# Patient Record
Sex: Female | Born: 1980 | Hispanic: Yes | Marital: Single | Smoking: Former smoker
Health system: Southern US, Community
[De-identification: ages and names within clinical notes are randomized; demographics above are authoritative.]

## PROBLEM LIST (undated history)

## (undated) HISTORY — PX: LAPAROSCOPIC GASTRIC SLEEVE RESECTION: SHX5895

## (undated) HISTORY — PX: BREAST ENHANCEMENT SURGERY: SHX7

---

## 2011-05-10 DIAGNOSIS — B009 Herpesviral infection, unspecified: Secondary | ICD-10-CM | POA: Insufficient documentation

## 2013-09-16 DIAGNOSIS — B977 Papillomavirus as the cause of diseases classified elsewhere: Secondary | ICD-10-CM | POA: Insufficient documentation

## 2015-11-19 LAB — OB RESULTS CONSOLE ABO/RH: RH Type: POSITIVE

## 2015-11-19 LAB — OB RESULTS CONSOLE ANTIBODY SCREEN: ANTIBODY SCREEN: NEGATIVE

## 2015-11-19 LAB — OB RESULTS CONSOLE HEPATITIS B SURFACE ANTIGEN: HEP B S AG: NEGATIVE

## 2015-11-19 LAB — OB RESULTS CONSOLE GC/CHLAMYDIA
CHLAMYDIA, DNA PROBE: NEGATIVE
GC PROBE AMP, GENITAL: NEGATIVE

## 2015-11-19 LAB — OB RESULTS CONSOLE HIV ANTIBODY (ROUTINE TESTING): HIV: NONREACTIVE

## 2015-11-19 LAB — OB RESULTS CONSOLE RUBELLA ANTIBODY, IGM: Rubella: IMMUNE

## 2015-11-19 LAB — OB RESULTS CONSOLE RPR: RPR: NONREACTIVE

## 2015-12-08 DIAGNOSIS — Z3490 Encounter for supervision of normal pregnancy, unspecified, unspecified trimester: Secondary | ICD-10-CM | POA: Insufficient documentation

## 2016-03-10 DIAGNOSIS — O99013 Anemia complicating pregnancy, third trimester: Secondary | ICD-10-CM | POA: Insufficient documentation

## 2016-03-17 ENCOUNTER — Encounter: Payer: Self-pay | Admitting: Obstetrics & Gynecology

## 2016-03-21 ENCOUNTER — Ambulatory Visit (INDEPENDENT_AMBULATORY_CARE_PROVIDER_SITE_OTHER): Payer: Medicaid Other | Admitting: Obstetrics and Gynecology

## 2016-03-21 ENCOUNTER — Encounter: Payer: Self-pay | Admitting: Obstetrics and Gynecology

## 2016-03-21 DIAGNOSIS — O99013 Anemia complicating pregnancy, third trimester: Secondary | ICD-10-CM | POA: Diagnosis not present

## 2016-03-21 DIAGNOSIS — D649 Anemia, unspecified: Secondary | ICD-10-CM | POA: Diagnosis not present

## 2016-03-21 DIAGNOSIS — B009 Herpesviral infection, unspecified: Secondary | ICD-10-CM | POA: Insufficient documentation

## 2016-03-21 DIAGNOSIS — Z3403 Encounter for supervision of normal first pregnancy, third trimester: Secondary | ICD-10-CM | POA: Diagnosis not present

## 2016-03-21 NOTE — Progress Notes (Signed)
Pt states that medical records were to be mailed. Not seen in Media tab today. Records in CareEverywhere. Pt states that she has had all pregnancy testing, labs-GTT, ultrasound- pt states she had u/s at 28 weeks- pt states she has small previa, now resolved.

## 2016-03-21 NOTE — Patient Instructions (Signed)
Contraception Choices Contraception (birth control) is the use of any methods or devices to prevent pregnancy. Below are some methods to help avoid pregnancy. HORMONAL METHODS   Contraceptive implant. This is a thin, plastic tube containing progesterone hormone. It does not contain estrogen hormone. Your health care provider inserts the tube in the inner part of the upper arm. The tube can remain in place for up to 3 years. After 3 years, the implant must be removed. The implant prevents the ovaries from releasing an egg (ovulation), thickens the cervical mucus to prevent sperm from entering the uterus, and thins the lining of the inside of the uterus.  Progesterone-only injections. These injections are given every 3 months by your health care provider to prevent pregnancy. This synthetic progesterone hormone stops the ovaries from releasing eggs. It also thickens cervical mucus and changes the uterine lining. This makes it harder for sperm to survive in the uterus.  Birth control pills. These pills contain estrogen and progesterone hormone. They work by preventing the ovaries from releasing eggs (ovulation). They also cause the cervical mucus to thicken, preventing the sperm from entering the uterus. Birth control pills are prescribed by a health care provider.Birth control pills can also be used to treat heavy periods.  Minipill. This type of birth control pill contains only the progesterone hormone. They are taken every day of each month and must be prescribed by your health care provider.  Birth control patch. The patch contains hormones similar to those in birth control pills. It must be changed once a week and is prescribed by a health care provider.  Vaginal ring. The ring contains hormones similar to those in birth control pills. It is left in the vagina for 3 weeks, removed for 1 week, and then a new one is put back in place. The patient must be comfortable inserting and removing the ring  from the vagina.A health care provider's prescription is necessary.  Emergency contraception. Emergency contraceptives prevent pregnancy after unprotected sexual intercourse. This pill can be taken right after sex or up to 5 days after unprotected sex. It is most effective the sooner you take the pills after having sexual intercourse. Most emergency contraceptive pills are available without a prescription. Check with your pharmacist. Do not use emergency contraception as your only form of birth control. BARRIER METHODS   Female condom. This is a thin sheath (latex or rubber) that is worn over the penis during sexual intercourse. It can be used with spermicide to increase effectiveness.  Female condom. This is a soft, loose-fitting sheath that is put into the vagina before sexual intercourse.  Diaphragm. This is a soft, latex, dome-shaped barrier that must be fitted by a health care provider. It is inserted into the vagina, along with a spermicidal jelly. It is inserted before intercourse. The diaphragm should be left in the vagina for 6 to 8 hours after intercourse.  Cervical cap. This is a round, soft, latex or plastic cup that fits over the cervix and must be fitted by a health care provider. The cap can be left in place for up to 48 hours after intercourse.  Sponge. This is a soft, circular piece of polyurethane foam. The sponge has spermicide in it. It is inserted into the vagina after wetting it and before sexual intercourse.  Spermicides. These are chemicals that kill or block sperm from entering the cervix and uterus. They come in the form of creams, jellies, suppositories, foam, or tablets. They do not require a   prescription. They are inserted into the vagina with an applicator before having sexual intercourse. The process must be repeated every time you have sexual intercourse. INTRAUTERINE CONTRACEPTION  Intrauterine device (IUD). This is a T-shaped device that is put in a woman's uterus  during a menstrual period to prevent pregnancy. There are 2 types:  Copper IUD. This type of IUD is wrapped in copper wire and is placed inside the uterus. Copper makes the uterus and fallopian tubes produce a fluid that kills sperm. It can stay in place for 10 years.  Hormone IUD. This type of IUD contains the hormone progestin (synthetic progesterone). The hormone thickens the cervical mucus and prevents sperm from entering the uterus, and it also thins the uterine lining to prevent implantation of a fertilized egg. The hormone can weaken or kill the sperm that get into the uterus. It can stay in place for 3-5 years, depending on which type of IUD is used. PERMANENT METHODS OF CONTRACEPTION  Female tubal ligation. This is when the woman's fallopian tubes are surgically sealed, tied, or blocked to prevent the egg from traveling to the uterus.  Hysteroscopic sterilization. This involves placing a small coil or insert into each fallopian tube. Your doctor uses a technique called hysteroscopy to do the procedure. The device causes scar tissue to form. This results in permanent blockage of the fallopian tubes, so the sperm cannot fertilize the egg. It takes about 3 months after the procedure for the tubes to become blocked. You must use another form of birth control for these 3 months.  Female sterilization. This is when the female has the tubes that carry sperm tied off (vasectomy).This blocks sperm from entering the vagina during sexual intercourse. After the procedure, the man can still ejaculate fluid (semen). NATURAL PLANNING METHODS  Natural family planning. This is not having sexual intercourse or using a barrier method (condom, diaphragm, cervical cap) on days the woman could become pregnant.  Calendar method. This is keeping track of the length of each menstrual cycle and identifying when you are fertile.  Ovulation method. This is avoiding sexual intercourse during ovulation.  Symptothermal  method. This is avoiding sexual intercourse during ovulation, using a thermometer and ovulation symptoms.  Post-ovulation method. This is timing sexual intercourse after you have ovulated. Regardless of which type or method of contraception you choose, it is important that you use condoms to protect against the transmission of sexually transmitted infections (STIs). Talk with your health care provider about which form of contraception is most appropriate for you.   This information is not intended to replace advice given to you by your health care provider. Make sure you discuss any questions you have with your health care provider.   Document Released: 04/25/2005 Document Revised: 04/30/2013 Document Reviewed: 10/18/2012 Elsevier Interactive Patient Education 2016 [employer]  

## 2016-03-21 NOTE — Progress Notes (Signed)
   PRENATAL VISIT NOTE  Subjective:  Evelyn [name] is a 35 y.o. G1P0 at 2249w5d being seen today for first prenatal visit with this office. [facility] were reviewed and she has had an uncomplicated pregnancy thus far. All labs are up to date and have been reviewed. She is currently monitored for the following issues for this low-risk pregnancy and has Supervision of normal first pregnancy in third trimester and Anogenital herpes simplex virus (HSV) infection on her problem list.  Patient reports no complaints.  Contractions: Not present. Vag. Bleeding: None.  Movement: Present. Denies leaking of fluid.   The following portions of the patient's history were reviewed and updated as appropriate: allergies, current medications, past family history, past medical history, past social history, past surgical history and problem list. Problem list updated.  Objective:   Vitals:   03/21/16 0832  BP: 122/80  Pulse: 92  Temp: 98 F (36.7 C)  Weight: 197 lb 3.2 oz (89.4 kg)    Fetal Status: Fetal Heart Rate (bpm): 156 Fundal Height: 29 cm Movement: Present     General:  Alert, oriented and cooperative. Patient is in no acute distress.  Skin: Skin is warm and dry. No rash noted.   Cardiovascular: Normal heart rate noted  Respiratory: Normal respiratory effort, no problems with respiration noted  Abdomen: Soft, gravid, appropriate for gestational age. Pain/Pressure: Present     Pelvic:  Cervical exam deferred        Extremities: Normal range of motion.     Mental Status: Normal mood and affect. Normal behavior. Normal judgment and thought content.   Assessment and Plan:  Pregnancy: G1P0 at 2149w5d  1. Supervision of first normal pregnancy - patient is doing well without complaints - Information regarding our practise reviewed with the patient  Preterm labor symptoms and general obstetric precautions including but not limited to vaginal bleeding, contractions,  leaking of fluid and fetal movement were reviewed in detail with the patient. Please refer to After Visit Summary for other counseling recommendations.  Return in about 2 weeks (around 04/04/2016) for ROB.   Catalina AntiguaPeggy Dineen Conradt, MD

## 2016-03-22 ENCOUNTER — Encounter: Payer: Self-pay | Admitting: *Deleted

## 2016-04-05 ENCOUNTER — Ambulatory Visit (INDEPENDENT_AMBULATORY_CARE_PROVIDER_SITE_OTHER): Payer: Medicaid Other | Admitting: Obstetrics & Gynecology

## 2016-04-05 VITALS — BP 108/70 | HR 88 | Temp 97.2°F | Wt 204.2 lb

## 2016-04-05 DIAGNOSIS — O98313 Other infections with a predominantly sexual mode of transmission complicating pregnancy, third trimester: Secondary | ICD-10-CM

## 2016-04-05 DIAGNOSIS — O99013 Anemia complicating pregnancy, third trimester: Secondary | ICD-10-CM | POA: Diagnosis not present

## 2016-04-05 DIAGNOSIS — A6009 Herpesviral infection of other urogenital tract: Secondary | ICD-10-CM

## 2016-04-05 DIAGNOSIS — A609 Anogenital herpesviral infection, unspecified: Secondary | ICD-10-CM

## 2016-04-05 DIAGNOSIS — Z3403 Encounter for supervision of normal first pregnancy, third trimester: Secondary | ICD-10-CM | POA: Diagnosis not present

## 2016-04-05 DIAGNOSIS — D649 Anemia, unspecified: Secondary | ICD-10-CM | POA: Diagnosis not present

## 2016-04-05 NOTE — Progress Notes (Signed)
Patient states that she feels good, reports good fetal movement. 

## 2016-04-05 NOTE — Progress Notes (Signed)
   PRENATAL VISIT NOTE  Subjective:  [name] is a 35 y.o. G1P0 at 8149w6d being seen today for ongoing prenatal care.  She is currently monitored for the following issues for this low-risk pregnancy and has Supervision of normal first pregnancy in third trimester and Anogenital herpes simplex virus (HSV) infection on her problem list.  Patient reports no complaints.  Contractions: Not present. Vag. Bleeding: None.  Movement: Present. Denies leaking of fluid.   The following portions of the patient's history were reviewed and updated as appropriate: allergies, current medications, past family history, past medical history, past social history, past surgical history and problem list. Problem list updated.  Objective:   Vitals:   04/05/16 1108  BP: 108/70  Pulse: 88  Temp: 97.2 F (36.2 C)  Weight: 204 lb 3.2 oz (92.6 kg)    Fetal Status: Fetal Heart Rate (bpm): 156   Movement: Present     General:  Alert, oriented and cooperative. Patient is in no acute distress.  Skin: Skin is warm and dry. No rash noted.   Cardiovascular: Normal heart rate noted  Respiratory: Normal respiratory effort, no problems with respiration noted  Abdomen: Soft, gravid, appropriate for gestational age. Pain/Pressure: Absent     Pelvic:  Cervical exam deferred        Extremities: Normal range of motion.  Edema: None  Mental Status: Normal mood and affect. Normal behavior. Normal judgment and thought content.   Assessment and Plan:  Pregnancy: G1P0 at 8049w6d  1. Supervision of normal first pregnancy in third trimester Doing well H/O lab dx of exposure to HSV with no outbreak  Preterm labor symptoms and general obstetric precautions including but not limited to vaginal bleeding, contractions, leaking of fluid and fetal movement were reviewed in detail with the patient. Please refer to After Visit Summary for other counseling recommendations.  Return in about 2 weeks (around 04/19/2016).   Adam PhenixJames G  Arnold, MD

## 2016-04-05 NOTE — Patient Instructions (Signed)

## 2016-04-15 ENCOUNTER — Encounter: Payer: Self-pay | Admitting: General Practice

## 2016-04-25 ENCOUNTER — Ambulatory Visit (INDEPENDENT_AMBULATORY_CARE_PROVIDER_SITE_OTHER): Payer: Medicaid Other | Admitting: Certified Nurse Midwife

## 2016-04-25 VITALS — BP 122/82 | HR 88 | Wt 208.8 lb

## 2016-04-25 DIAGNOSIS — D649 Anemia, unspecified: Secondary | ICD-10-CM | POA: Diagnosis not present

## 2016-04-25 DIAGNOSIS — K219 Gastro-esophageal reflux disease without esophagitis: Secondary | ICD-10-CM | POA: Insufficient documentation

## 2016-04-25 DIAGNOSIS — Z3403 Encounter for supervision of normal first pregnancy, third trimester: Secondary | ICD-10-CM | POA: Diagnosis not present

## 2016-04-25 DIAGNOSIS — B009 Herpesviral infection, unspecified: Secondary | ICD-10-CM

## 2016-04-25 DIAGNOSIS — O99013 Anemia complicating pregnancy, third trimester: Secondary | ICD-10-CM

## 2016-04-25 DIAGNOSIS — Z9889 Other specified postprocedural states: Secondary | ICD-10-CM

## 2016-04-25 DIAGNOSIS — O99613 Diseases of the digestive system complicating pregnancy, third trimester: Secondary | ICD-10-CM

## 2016-04-25 DIAGNOSIS — Z9884 Bariatric surgery status: Secondary | ICD-10-CM | POA: Insufficient documentation

## 2016-04-25 MED ORDER — OMEPRAZOLE 20 MG PO CPDR: 20.0000 mg | DELAYED_RELEASE_CAPSULE | Freq: Two times a day (BID) | ORAL | 5 refills | Status: DC

## 2016-04-25 MED ORDER — VALACYCLOVIR HCL 1 G PO TABS: 1000.0000 mg | ORAL_TABLET | Freq: Every day | ORAL | 1 refills | Status: DC

## 2016-04-25 NOTE — Progress Notes (Signed)
Subjective:    Evelyn [name] is a 35 y.o. female being seen today for her obstetrical visit. She is at 2144w5d gestation. Patient reports no bleeding, no contractions, no cramping, no leaking and pressure with prolonged standing/walking, vertigo in the shower.  Has a hx of anemia, recheck cbc today. . Fetal movement: normal.  Problem List Items Addressed This Visit      Other   Supervision of normal first pregnancy in third trimester - Primary   Herpes simplex virus (HSV) infection   Relevant Medications   valACYclovir (VALTREX) 1000 MG tablet   History of gastric bypass   Gastroesophageal reflux in pregnancy in third trimester   Relevant Medications   omeprazole (PRILOSEC) 20 MG capsule   Anemia during pregnancy in third trimester   Relevant Orders   CBC     Patient Active Problem List   Diagnosis Date Noted  . History of gastric bypass 04/25/2016  . Gastroesophageal reflux in pregnancy in third trimester 04/25/2016  . Anemia during pregnancy in third trimester 04/25/2016  . Supervision of normal first pregnancy in third trimester 03/21/2016  . Herpes simplex virus (HSV) infection 03/21/2016   Objective:    BP 122/82   Pulse 88   Wt 208 lb 12.8 oz (94.7 kg)   LMP 08/26/2015 (Exact Date)  FHT:  131 BPM  Uterine Size: 34 cm and size equals dates  Presentation: cephalic     Assessment:    Pregnancy @ 4144w5d weeks   H/O Gastric bypass  H/O HSV by blood work: no hx of outbreak  H/O anemia  Reflux in pregnancy  Plan:   Check CBC, hx of anemia   labs reviewed, problem list updated   PTL precautions given GBS planning TDAP offered  Rhogam: n/a Pediatrician: discussed, list given Infant feeding: plans to breastfeed. Maternity leave: n/a. Cigarette smoking: never smoked. Orders Placed This Encounter  Procedures  . CBC   Meds ordered this encounter  Medications  . valACYclovir (VALTREX) 1000 MG tablet    Sig: Take 1 tablet (1,000 mg total) by mouth daily.   Dispense:  30 tablet    Refill:  1  . omeprazole (PRILOSEC) 20 MG capsule    Sig: Take 1 capsule (20 mg total) by mouth 2 (two) times daily before a meal.    Dispense:  60 capsule    Refill:  5   Follow up in 1 Week.

## 2016-04-25 NOTE — Progress Notes (Signed)
Pressure when sitting or walking for long time. Patient had dizzy spell today when she got out of the shower.

## 2016-05-03 ENCOUNTER — Ambulatory Visit (INDEPENDENT_AMBULATORY_CARE_PROVIDER_SITE_OTHER): Payer: Medicaid Other | Admitting: Certified Nurse Midwife

## 2016-05-03 ENCOUNTER — Encounter: Payer: Self-pay | Admitting: Certified Nurse Midwife

## 2016-05-03 VITALS — BP 113/73 | HR 83 | Wt 214.0 lb

## 2016-05-03 DIAGNOSIS — Z3403 Encounter for supervision of normal first pregnancy, third trimester: Secondary | ICD-10-CM

## 2016-05-03 DIAGNOSIS — B009 Herpesviral infection, unspecified: Secondary | ICD-10-CM

## 2016-05-03 DIAGNOSIS — O99013 Anemia complicating pregnancy, third trimester: Secondary | ICD-10-CM | POA: Diagnosis not present

## 2016-05-03 DIAGNOSIS — D649 Anemia, unspecified: Secondary | ICD-10-CM | POA: Diagnosis not present

## 2016-05-03 LAB — OB RESULTS CONSOLE GBS: STREP GROUP B AG: NEGATIVE

## 2016-05-03 MED ORDER — VALACYCLOVIR HCL 1 G PO TABS: 1000.0000 mg | ORAL_TABLET | Freq: Every day | ORAL | 1 refills | Status: DC

## 2016-05-03 NOTE — Patient Instructions (Addendum)
Third Trimester of Pregnancy The third trimester is from week 29 through week 40 (months 7 through 9). The third trimester is a time when the unborn baby (fetus) is growing rapidly. At the end of the ninth month, the fetus is about 20 inches in length and weighs 6-10 pounds. Body changes during your third trimester Your body goes through many changes during pregnancy. The changes vary from woman to woman. During the third trimester:  Your weight will continue to increase. You can expect to gain 25-35 pounds (11-16 kg) by the end of the pregnancy.  You may begin to get stretch marks on your hips, abdomen, and breasts.  You may urinate more often because the fetus is moving lower into your pelvis and pressing on your bladder.  You may develop or continue to have heartburn. This is caused by increased hormones that slow down muscles in the digestive tract.  You may develop or continue to have constipation because increased hormones slow digestion and cause the muscles that push waste through your intestines to relax.  You may develop hemorrhoids. These are swollen veins (varicose veins) in the rectum that can itch or be painful.  You may develop swollen, bulging veins (varicose veins) in your legs.  You may have increased body aches in the pelvis, back, or thighs. This is due to weight gain and increased hormones that are relaxing your joints.  You may have changes in your hair. These can include thickening of your hair, rapid growth, and changes in texture. Some women also have hair loss during or after pregnancy, or hair that feels dry or thin. Your hair will most likely return to normal after your baby is born.  Your breasts will continue to grow and they will continue to become tender. A yellow fluid (colostrum) may leak from your breasts. This is the first milk you are producing for your baby.  Your belly button may stick out.  You may notice more swelling in your hands, face, or  ankles.  You may have increased tingling or numbness in your hands, arms, and legs. The skin on your belly may also feel numb.  You may feel short of breath because of your expanding uterus.  You may have more problems sleeping. This can be caused by the size of your belly, increased need to urinate, and an increase in your body's metabolism.  You may notice the fetus "dropping," or moving lower in your abdomen.  You may have increased vaginal discharge.  Your cervix becomes thin and soft (effaced) near your due date. What to expect at prenatal visits You will have prenatal exams every 2 weeks until week 36. Then you will have weekly prenatal exams. During a routine prenatal visit:  You will be weighed to make sure you and the fetus are growing normally.  Your blood pressure will be taken.  Your abdomen will be measured to track your baby's growth.  The fetal heartbeat will be listened to.  Any test results from the previous visit will be discussed.  You may have a cervical check near your due date to see if you have effaced. At around 36 weeks, your health care provider will check your cervix. At the same time, your health care provider will also perform a test on the secretions of the vaginal tissue. This test is to determine if a type of bacteria, Group B streptococcus, is present. Your health care provider will explain this further. Your health care provider may ask you:    What your birth plan is.  How you are feeling.  If you are feeling the baby move.  If you have had any abnormal symptoms, such as leaking fluid, bleeding, severe headaches, or abdominal cramping.  If you are using any tobacco products, including cigarettes, chewing tobacco, and electronic cigarettes.  If you have any questions. Other tests or screenings that may be performed during your third trimester include:  Blood tests that check for low iron levels (anemia).  Fetal testing to check the health,  activity level, and growth of the fetus. Testing is done if you have certain medical conditions or if there are problems during the pregnancy.  Nonstress test (NST). This test checks the health of your baby to make sure there are no signs of problems, such as the baby not getting enough oxygen. During this test, a belt is placed around your belly. The baby is made to move, and its heart rate is monitored during movement. What is false labor? False labor is a condition in which you feel small, irregular tightenings of the muscles in the womb (contractions) that eventually go away. These are called Braxton Hicks contractions. Contractions may last for hours, days, or even weeks before true labor sets in. If contractions come at regular intervals, become more frequent, increase in intensity, or become painful, you should see your health care provider. What are the signs of labor?  Abdominal cramps.  Regular contractions that start at 10 minutes apart and become stronger and more frequent with time.  Contractions that start on the top of the uterus and spread down to the lower abdomen and back.  Increased pelvic pressure and dull back pain.  A watery or bloody mucus discharge that comes from the vagina.  Leaking of amniotic fluid. This is also known as your "water breaking." It could be a slow trickle or a gush. Let your doctor know if it has a color or strange odor. If you have any of these signs, call your health care provider right away, even if it is before your due date. Follow these instructions at home: Eating and drinking  Continue to eat regular, healthy meals.  Do not eat:  Raw meat or meat spreads.  Unpasteurized milk or cheese.  Unpasteurized juice.  Store-made salad.  Refrigerated smoked seafood.  Hot dogs or deli meat, unless they are piping hot.  More than 6 ounces of albacore tuna a week.  Shark, swordfish, king mackerel, or tile fish.  Store-made salads.  Raw  sprouts, such as mung bean or alfalfa sprouts.  Take prenatal vitamins as told by your health care provider.  Take 1000 mg of calcium daily as told by your health care provider.  If you develop constipation:  Take over-the-counter or prescription medicines.  Drink enough fluid to keep your urine clear or pale yellow.  Eat foods that are high in fiber, such as fresh fruits and vegetables, whole grains, and beans.  Limit foods that are high in fat and processed sugars, such as fried and sweet foods. Activity  Exercise only as directed by your health care provider. Healthy pregnant women should aim for 2 hours and 30 minutes of moderate exercise per week. If you experience any pain or discomfort while exercising, stop.  Avoid heavy lifting.  Do not exercise in extreme heat or humidity, or at high altitudes.  Wear low-heel, comfortable shoes.  Practice good posture.  Do not travel far distances unless it is absolutely necessary and only with the approval   of your health care provider.  Wear your seat belt at all times while in a car, on a bus, or on a plane.  Take frequent breaks and rest with your legs elevated if you have leg cramps or low back pain.  Do not use hot tubs, steam rooms, or saunas.  You may continue to have sex unless your health care provider tells you otherwise. Lifestyle  Do not use any products that contain nicotine or tobacco, such as cigarettes and e-cigarettes. If you need help quitting, ask your health care provider.  Do not drink alcohol.  Do not use any medicinal herbs or unprescribed drugs. These chemicals affect the formation and growth of the baby.  If you develop varicose veins:  Wear support pantyhose or compression stockings as told by your healthcare provider.  Elevate your feet for 15 minutes, 3-4 times a day.  Wear a supportive maternity bra to help with breast tenderness. General instructions  Take over-the-counter and prescription  medicines only as told by your health care provider. There are medicines that are either safe or unsafe to take during pregnancy.  Take warm sitz baths to soothe any pain or discomfort caused by hemorrhoids. Use hemorrhoid cream or witch hazel if your health care provider approves.  Avoid cat litter boxes and soil used by cats. These carry germs that can cause birth defects in the baby. If you have a cat, ask someone to clean the litter box for you.  To prepare for the arrival of your baby:  Take prenatal classes to understand, practice, and ask questions about the labor and delivery.  Make a trial run to the hospital.  Visit the hospital and tour the maternity area.  Arrange for maternity or paternity leave through employers.  Arrange for family and friends to take care of pets while you are in the hospital.  Purchase a rear-facing car seat and make sure you know how to install it in your car.  Pack your hospital bag.  Prepare the baby's nursery. Make sure to remove all pillows and stuffed animals from the baby's crib to prevent suffocation.  Visit your dentist if you have not gone during your pregnancy. Use a soft toothbrush to brush your teeth and be gentle when you floss.  Keep all prenatal follow-up visits as told by your health care provider. This is important. Contact a health care provider if:  You are unsure if you are in labor or if your water has broken.  You become dizzy.  You have mild pelvic cramps, pelvic pressure, or nagging pain in your abdominal area.  You have lower back pain.  You have persistent nausea, vomiting, or diarrhea.  You have an unusual or bad smelling vaginal discharge.  You have pain when you urinate. Get help right away if:  You have a fever.  You are leaking fluid from your vagina.  You have spotting or bleeding from your vagina.  You have severe abdominal pain or cramping.  You have rapid weight loss or weight gain.  You have  shortness of breath with chest pain.  You notice sudden or extreme swelling of your face, hands, ankles, feet, or legs.  Your baby makes fewer than 10 movements in 2 hours.  You have severe headaches that do not go away with medicine.  You have vision changes. Summary  The third trimester is from week 29 through week 40, months 7 through 9. The third trimester is a time when the unborn baby (fetus)   is growing rapidly.  During the third trimester, your discomfort may increase as you and your baby continue to gain weight. You may have abdominal, leg, and back pain, sleeping problems, and an increased need to urinate.  During the third trimester your breasts will keep growing and they will continue to become tender. A yellow fluid (colostrum) may leak from your breasts. This is the first milk you are producing for your baby.  False labor is a condition in which you feel small, irregular tightenings of the muscles in the womb (contractions) that eventually go away. These are called Braxton Hicks contractions. Contractions may last for hours, days, or even weeks before true labor sets in.  Signs of labor can include: abdominal cramps; regular contractions that start at 10 minutes apart and become stronger and more frequent with time; watery or bloody mucus discharge that comes from the vagina; increased pelvic pressure and dull back pain; and leaking of amniotic fluid. This information is not intended to replace advice given to you by your health care provider. Make sure you discuss any questions you have with your health care provider. Document Released: 04/19/2001 Document Revised: 10/01/2015 Document Reviewed: 06/26/2012 Elsevier Interactive Patient Education  2017 [employer]  

## 2016-05-03 NOTE — Progress Notes (Signed)
Subjective:    Evelyn [name] is a 35 y.o. female being seen today for her obstetrical visit. She is at 3310w6d gestation. Patient reports no complaints. Fetal movement: normal.  Problem List Items Addressed This Visit      Other   Supervision of normal first pregnancy in third trimester - Primary   Relevant Orders   Strep Gp B NAA   NuSwab Vaginitis Plus (VG+)   CBC with Differential/Platelet   Herpes simplex virus (HSV) infection     Patient Active Problem List   Diagnosis Date Noted  . History of gastric bypass 04/25/2016  . Gastroesophageal reflux in pregnancy in third trimester 04/25/2016  . Anemia during pregnancy in third trimester 04/25/2016  . Supervision of normal first pregnancy in third trimester 03/21/2016  . Herpes simplex virus (HSV) infection 03/21/2016   Objective:    BP 113/73   Pulse 83   Wt 214 lb (97.1 kg)   LMP 08/26/2015 (Exact Date)  FHT:  135 BPM  Uterine Size: 35 cm and size equals dates  Presentation: cephalic     Assessment:    Pregnancy @ 7410w6d weeks   Doing well  [name]:     labs reviewed, problem list updated   Discussed HSV results with Dr. Ladean Rayaonstance: no Valtrex needed for prophylaxis since she has never had an outbreak.  GBS sent TDAP offered/declined  Rhogam given for RH negative Pediatrician: discussed. Infant feeding: plans to breastfeed. Maternity leave: n/a. Cigarette smoking: never smoked. Orders Placed This Encounter  Procedures  . Strep Gp B NAA  . NuSwab Vaginitis Plus (VG+)  . CBC with Differential/Platelet   Meds ordered this encounter  Medications  . DISCONTD: valACYclovir (VALTREX) 1000 MG tablet    Sig: Take 1 tablet (1,000 mg total) by mouth daily.    Dispense:  30 tablet    Refill:  1   Follow up in 1 Week.

## 2016-05-04 LAB — OB RESULTS CONSOLE GBS: GBS: NEGATIVE

## 2016-05-05 LAB — STREP GP B NAA: Strep Gp B NAA: NEGATIVE

## 2016-05-07 LAB — NUSWAB VAGINITIS PLUS (VG+)
CANDIDA ALBICANS, NAA: NEGATIVE
CANDIDA GLABRATA, NAA: NEGATIVE
CHLAMYDIA TRACHOMATIS, NAA: NEGATIVE
NEISSERIA GONORRHOEAE, NAA: NEGATIVE
Trich vag by NAA: NEGATIVE

## 2016-05-11 ENCOUNTER — Ambulatory Visit (INDEPENDENT_AMBULATORY_CARE_PROVIDER_SITE_OTHER): Payer: Medicaid Other | Admitting: Certified Nurse Midwife

## 2016-05-11 ENCOUNTER — Encounter: Payer: Self-pay | Admitting: Certified Nurse Midwife

## 2016-05-11 VITALS — BP 106/72 | HR 99 | Wt 215.0 lb

## 2016-05-11 DIAGNOSIS — K219 Gastro-esophageal reflux disease without esophagitis: Secondary | ICD-10-CM

## 2016-05-11 DIAGNOSIS — O99613 Diseases of the digestive system complicating pregnancy, third trimester: Secondary | ICD-10-CM

## 2016-05-11 DIAGNOSIS — Z9889 Other specified postprocedural states: Secondary | ICD-10-CM

## 2016-05-11 DIAGNOSIS — Z9884 Bariatric surgery status: Secondary | ICD-10-CM

## 2016-05-11 DIAGNOSIS — D649 Anemia, unspecified: Secondary | ICD-10-CM | POA: Diagnosis not present

## 2016-05-11 DIAGNOSIS — B009 Herpesviral infection, unspecified: Secondary | ICD-10-CM

## 2016-05-11 DIAGNOSIS — Z3403 Encounter for supervision of normal first pregnancy, third trimester: Secondary | ICD-10-CM | POA: Diagnosis not present

## 2016-05-11 DIAGNOSIS — O99013 Anemia complicating pregnancy, third trimester: Secondary | ICD-10-CM | POA: Diagnosis not present

## 2016-05-11 NOTE — Progress Notes (Signed)
Subjective:    Evelyn [name] is a 36 y.o. female being seen today for her obstetrical visit. She is at 7298w0d gestation. Patient reports no complaints. Fetal movement: normal.  Problem List Items Addressed This Visit      Other   Supervision of normal first pregnancy in third trimester - Primary   Relevant Orders   CBC (Completed)   Herpes simplex virus (HSV) infection   History of gastric bypass   Gastroesophageal reflux in pregnancy in third trimester   Anemia during pregnancy in third trimester   Relevant Orders   CBC (Completed)     Patient Active Problem List   Diagnosis Date Noted  . History of gastric bypass 04/25/2016  . Gastroesophageal reflux in pregnancy in third trimester 04/25/2016  . Anemia during pregnancy in third trimester 04/25/2016  . Supervision of normal first pregnancy in third trimester 03/21/2016  . Herpes simplex virus (HSV) infection 03/21/2016    Objective:    BP 106/72   Pulse 99   Wt 215 lb (97.5 kg)   LMP 08/26/2015 (Exact Date)  FHT: 140 BPM  Uterine Size: 37 cm and size equals dates  Presentations: cephalic  Pelvic Exam: deferred     Assessment:    Pregnancy @ 6798w0d weeks   Doing well  Plan:   Plans for delivery: Vaginal anticipated; labs reviewed; problem list updated  Infant feeding: plans to breastfeed. Cigarette smoking: never smoked. L&D discussion: symptoms of labor, discussed when to call, discussed what number to call, anesthetic/analgesic options reviewed and delivering clinician:  plans no preference. Postpartum supports and preparation: circumcision discussed and contraception plans discussed.  Follow up in 1 Week.

## 2016-05-12 ENCOUNTER — Other Ambulatory Visit: Payer: Self-pay | Admitting: Certified Nurse Midwife

## 2016-05-12 LAB — CBC
HEMATOCRIT: 33.1 % — AB (ref 34.0–46.6)
HEMOGLOBIN: 10.4 g/dL — AB (ref 11.1–15.9)
MCH: 24.4 pg — ABNORMAL LOW (ref 26.6–33.0)
MCHC: 31.4 g/dL — ABNORMAL LOW (ref 31.5–35.7)
MCV: 78 fL — ABNORMAL LOW (ref 79–97)
Platelets: 190 10*3/uL (ref 150–379)
RBC: 4.26 x10E6/uL (ref 3.77–5.28)
RDW: 15.3 % (ref 12.3–15.4)
WBC: 10.7 10*3/uL (ref 3.4–10.8)

## 2016-05-18 ENCOUNTER — Ambulatory Visit (INDEPENDENT_AMBULATORY_CARE_PROVIDER_SITE_OTHER): Payer: Medicaid Other | Admitting: Certified Nurse Midwife

## 2016-05-18 VITALS — BP 129/80 | HR 98 | Temp 97.2°F | Wt 217.2 lb

## 2016-05-18 DIAGNOSIS — E669 Obesity, unspecified: Secondary | ICD-10-CM

## 2016-05-18 DIAGNOSIS — B009 Herpesviral infection, unspecified: Secondary | ICD-10-CM

## 2016-05-18 DIAGNOSIS — D649 Anemia, unspecified: Secondary | ICD-10-CM | POA: Diagnosis not present

## 2016-05-18 DIAGNOSIS — Z3403 Encounter for supervision of normal first pregnancy, third trimester: Secondary | ICD-10-CM

## 2016-05-18 DIAGNOSIS — A6009 Herpesviral infection of other urogenital tract: Secondary | ICD-10-CM

## 2016-05-18 DIAGNOSIS — K219 Gastro-esophageal reflux disease without esophagitis: Secondary | ICD-10-CM

## 2016-05-18 DIAGNOSIS — O99013 Anemia complicating pregnancy, third trimester: Secondary | ICD-10-CM | POA: Diagnosis not present

## 2016-05-18 DIAGNOSIS — O99613 Diseases of the digestive system complicating pregnancy, third trimester: Secondary | ICD-10-CM

## 2016-05-18 DIAGNOSIS — Z9884 Bariatric surgery status: Secondary | ICD-10-CM

## 2016-05-18 DIAGNOSIS — O98313 Other infections with a predominantly sexual mode of transmission complicating pregnancy, third trimester: Secondary | ICD-10-CM

## 2016-05-18 NOTE — Progress Notes (Signed)
   PRENATAL VISIT NOTE  Subjective:  Evelyn [name] is a 36 y.o. G1P0 at 8013w0d being seen today for ongoing prenatal care.  She is currently monitored for the following issues for this low-risk pregnancy and has Supervision of normal first pregnancy in third trimester; Herpes simplex virus (HSV) infection; History of gastric bypass; Gastroesophageal reflux in pregnancy in third trimester; and Anemia during pregnancy in third trimester on her problem list.  Patient reports no complaints.   . Vag. Bleeding: None.  Movement: Absent. Denies leaking of fluid.   The following portions of the patient's history were reviewed and updated as appropriate: allergies, current medications, past family history, past medical history, past social history, past surgical history and problem list. Problem list updated.  Objective:   Vitals:   05/18/16 1615  BP: 129/80  Pulse: 98  Temp: 97.2 F (36.2 C)  Weight: 217 lb 3.2 oz (98.5 kg)    Fetal Status: Fetal Heart Rate (bpm): 146 Fundal Height: 37 cm Movement: Absent     General:  Alert, oriented and cooperative. Patient is in no acute distress.  Skin: Skin is warm and dry. No rash noted.   Cardiovascular: Normal heart rate noted  Respiratory: Normal respiratory effort, no problems with respiration noted  Abdomen: Soft, gravid, appropriate for gestational age. Pain/Pressure: Present     Pelvic:  Cervical exam deferred        Extremities: Normal range of motion.     Mental Status: Normal mood and affect. Normal behavior. Normal judgment and thought content.   Assessment and Plan:  Pregnancy: G1P0 at 6013w0d  1. Supervision of normal first pregnancy in third trimester      Acute URI  2. History of gastric bypass      3. Gastroesophageal reflux in pregnancy in third trimester      4. Anemia during pregnancy in third trimester    05/11/16: HgB: 10.4  Term labor symptoms and general obstetric precautions including but not limited to vaginal  bleeding, contractions, leaking of fluid and fetal movement were reviewed in detail with the patient. Please refer to After Visit Summary for other counseling recommendations.  Return in about 1 week (around 05/25/2016) for ROB.   Roe Coombsachelle A Noble Cicalese, CNM

## 2016-05-25 ENCOUNTER — Encounter: Payer: Medicaid Other | Admitting: Certified Nurse Midwife

## 2016-05-26 ENCOUNTER — Ambulatory Visit (INDEPENDENT_AMBULATORY_CARE_PROVIDER_SITE_OTHER): Payer: Medicaid Other | Admitting: Certified Nurse Midwife

## 2016-05-26 VITALS — BP 117/71 | HR 93 | Temp 97.1°F | Wt 222.2 lb

## 2016-05-26 DIAGNOSIS — O99613 Diseases of the digestive system complicating pregnancy, third trimester: Secondary | ICD-10-CM

## 2016-05-26 DIAGNOSIS — K219 Gastro-esophageal reflux disease without esophagitis: Secondary | ICD-10-CM

## 2016-05-26 DIAGNOSIS — O99013 Anemia complicating pregnancy, third trimester: Secondary | ICD-10-CM

## 2016-05-26 DIAGNOSIS — B009 Herpesviral infection, unspecified: Secondary | ICD-10-CM

## 2016-05-26 DIAGNOSIS — D649 Anemia, unspecified: Secondary | ICD-10-CM

## 2016-05-26 DIAGNOSIS — Z9884 Bariatric surgery status: Secondary | ICD-10-CM

## 2016-05-26 DIAGNOSIS — Z3403 Encounter for supervision of normal first pregnancy, third trimester: Secondary | ICD-10-CM

## 2016-05-26 NOTE — Progress Notes (Signed)
   PRENATAL VISIT NOTE  Subjective:  Evelyn [name] is a 36 y.o. G1P0 at 5861w1d being seen today for ongoing prenatal care.  She is currently monitored for the following issues for this low-risk pregnancy and has Supervision of normal first pregnancy in third trimester; Herpes simplex virus (HSV) infection; History of gastric bypass; Gastroesophageal reflux in pregnancy in third trimester; and Anemia during pregnancy in third trimester on her problem list.  Patient reports no complaints.  Contractions: Not present. Vag. Bleeding: None.  Movement: Present. Denies leaking of fluid.   The following portions of the patient's history were reviewed and updated as appropriate: allergies, current medications, past family history, past medical history, past social history, past surgical history and problem list. Problem list updated.  Objective:   Vitals:   05/26/16 1347  BP: 117/71  Pulse: 93  Temp: 97.1 F (36.2 C)  Weight: 222 lb 3.2 oz (100.8 kg)    Fetal Status: Fetal Heart Rate (bpm): 160 Fundal Height: 39 cm Movement: Present  Presentation: Vertex  General:  Alert, oriented and cooperative. Patient is in no acute distress.  Skin: Skin is warm and dry. No rash noted.   Cardiovascular: Normal heart rate noted  Respiratory: Normal respiratory effort, no problems with respiration noted  Abdomen: Soft, gravid, appropriate for gestational age. Pain/Pressure: Present     Pelvic:  Cervical exam performed Dilation: 1 Effacement (%): 50 Station: -2  Extremities: Normal range of motion.  Edema: Trace  Mental Status: Normal mood and affect. Normal behavior. Normal judgment and thought content.   Assessment and Plan:  Pregnancy: G1P0 at 1461w1d  1. Supervision of normal first pregnancy in third trimester      Doing well.  IOL discussed.    2. Anemia during pregnancy in third trimester     HbB: 10.4 on 05/11/16  3. Gastroesophageal reflux in pregnancy in third trimester     TUMS OTC  4.  Herpes simplex virus (HSV) infection     Never had an outbreak: no antivirals needed  5. History of gastric bypass     Stable  Term labor symptoms and general obstetric precautions including but not limited to vaginal bleeding, contractions, leaking of fluid and fetal movement were reviewed in detail with the patient. Please refer to After Visit Summary for other counseling recommendations.  Return in about 1 week (around 06/02/2016) for ROB, NST, IOL schedule.   Roe Coombsachelle A Traquan Duarte, CNM

## 2016-06-01 ENCOUNTER — Ambulatory Visit (INDEPENDENT_AMBULATORY_CARE_PROVIDER_SITE_OTHER): Payer: Medicaid Other | Admitting: Obstetrics & Gynecology

## 2016-06-01 ENCOUNTER — Encounter: Payer: Self-pay | Admitting: *Deleted

## 2016-06-01 ENCOUNTER — Inpatient Hospital Stay (HOSPITAL_COMMUNITY)
Admission: AD | Admit: 2016-06-01 | Payer: Medicaid Other | Source: Ambulatory Visit | Admitting: Obstetrics and Gynecology

## 2016-06-01 DIAGNOSIS — Z3403 Encounter for supervision of normal first pregnancy, third trimester: Secondary | ICD-10-CM

## 2016-06-01 DIAGNOSIS — D649 Anemia, unspecified: Secondary | ICD-10-CM | POA: Diagnosis not present

## 2016-06-01 DIAGNOSIS — O99013 Anemia complicating pregnancy, third trimester: Secondary | ICD-10-CM | POA: Diagnosis not present

## 2016-06-01 NOTE — Progress Notes (Signed)
   PRENATAL VISIT NOTE  Subjective:  [name] is a 36 y.o. G1P0 at 9470w0d being seen today for ongoing prenatal care.  She is currently monitored for the following issues for this low-risk pregnancy and has Supervision of normal first pregnancy in third trimester; Herpes simplex virus (HSV) infection; History of gastric bypass; Gastroesophageal reflux in pregnancy in third trimester; and Anemia during pregnancy in third trimester on her problem list.  Patient reports occasional contractions.  Contractions: Irritability. Vag. Bleeding: None.  Movement: Present. Denies leaking of fluid.   The following portions of the patient's history were reviewed and updated as appropriate: allergies, current medications, past family history, past medical history, past social history, past surgical history and problem list. Problem list updated.  Objective:   Vitals:   06/01/16 1437  BP: 123/84  Pulse: 76  Temp: 98 F (36.7 C)  Weight: 221 lb 14.4 oz (100.7 kg)    Fetal Status: Fetal Heart Rate (bpm): 147   Movement: Present     General:  Alert, oriented and cooperative. Patient is in no acute distress.  Skin: Skin is warm and dry. No rash noted.   Cardiovascular: Normal heart rate noted  Respiratory: Normal respiratory effort, no problems with respiration noted  Abdomen: Soft, gravid, appropriate for gestational age. Pain/Pressure: Present     Pelvic:  Cervical exam performed        Extremities: Normal range of motion.  Edema: Trace  Mental Status: Normal mood and affect. Normal behavior. Normal judgment and thought content.   Assessment and Plan:  Pregnancy: G1P0 at 5970w0d  1. Supervision of normal first pregnancy in third trimester 40 weeks, discussed IOL if 41 weeks  Term labor symptoms and general obstetric precautions including but not limited to vaginal bleeding, contractions, leaking of fluid and fetal movement were reviewed in detail with the patient. Please refer to After Visit  Summary for other counseling recommendations.  Return in about 2 days (around 06/03/2016) for NST, needs to be out of work starting now.   Adam PhenixJames G Arnold, MD

## 2016-06-01 NOTE — Progress Notes (Signed)
Patient is in the office, reports good fetal movement. 

## 2016-06-03 ENCOUNTER — Ambulatory Visit: Payer: Medicaid Other | Admitting: Obstetrics

## 2016-06-03 ENCOUNTER — Telehealth (HOSPITAL_COMMUNITY): Payer: Self-pay | Admitting: *Deleted

## 2016-06-03 VITALS — BP 117/78 | HR 73 | Wt 219.8 lb

## 2016-06-03 DIAGNOSIS — O480 Post-term pregnancy: Secondary | ICD-10-CM

## 2016-06-03 NOTE — Progress Notes (Signed)
Patient in office for NST only today- needs induction set up for post dates.

## 2016-06-03 NOTE — Telephone Encounter (Signed)
Preadmission screen  

## 2016-06-08 ENCOUNTER — Encounter (HOSPITAL_COMMUNITY): Payer: Self-pay

## 2016-06-08 ENCOUNTER — Inpatient Hospital Stay (HOSPITAL_COMMUNITY)
Admission: RE | Admit: 2016-06-08 | Discharge: 2016-06-11 | DRG: 766 | Disposition: A | Discharge status: Home / Self Care | Payer: Medicaid Other | Source: Ambulatory Visit | Attending: Obstetrics and Gynecology | Admitting: Obstetrics and Gynecology

## 2016-06-08 DIAGNOSIS — O9902 Anemia complicating childbirth: Secondary | ICD-10-CM | POA: Diagnosis present

## 2016-06-08 DIAGNOSIS — O9962 Diseases of the digestive system complicating childbirth: Secondary | ICD-10-CM | POA: Diagnosis present

## 2016-06-08 DIAGNOSIS — Z3403 Encounter for supervision of normal first pregnancy, third trimester: Secondary | ICD-10-CM

## 2016-06-08 DIAGNOSIS — Z3A41 41 weeks gestation of pregnancy: Secondary | ICD-10-CM | POA: Diagnosis not present

## 2016-06-08 DIAGNOSIS — Z87891 Personal history of nicotine dependence: Secondary | ICD-10-CM

## 2016-06-08 DIAGNOSIS — K219 Gastro-esophageal reflux disease without esophagitis: Secondary | ICD-10-CM | POA: Diagnosis present

## 2016-06-08 DIAGNOSIS — O324XX0 Maternal care for high head at term, not applicable or unspecified: Secondary | ICD-10-CM | POA: Diagnosis present

## 2016-06-08 DIAGNOSIS — B009 Herpesviral infection, unspecified: Secondary | ICD-10-CM

## 2016-06-08 DIAGNOSIS — D649 Anemia, unspecified: Secondary | ICD-10-CM | POA: Diagnosis present

## 2016-06-08 DIAGNOSIS — O480 Post-term pregnancy: Secondary | ICD-10-CM | POA: Diagnosis present

## 2016-06-08 DIAGNOSIS — O99844 Bariatric surgery status complicating childbirth: Secondary | ICD-10-CM | POA: Diagnosis present

## 2016-06-08 LAB — CBC
HCT: 32.9 % — ABNORMAL LOW (ref 36.0–46.0)
Hemoglobin: 10.8 g/dL — ABNORMAL LOW (ref 12.0–15.0)
MCH: 25.3 pg — AB (ref 26.0–34.0)
MCHC: 32.8 g/dL (ref 30.0–36.0)
MCV: 77 fL — ABNORMAL LOW (ref 78.0–100.0)
PLATELETS: 159 10*3/uL (ref 150–400)
RBC: 4.27 MIL/uL (ref 3.87–5.11)
RDW: 15.1 % (ref 11.5–15.5)
WBC: 9.7 10*3/uL (ref 4.0–10.5)

## 2016-06-08 LAB — TYPE AND SCREEN
ABO/RH(D): A POS
Antibody Screen: NEGATIVE

## 2016-06-08 LAB — ABO/RH: ABO/RH(D): A POS

## 2016-06-08 LAB — HIV ANTIBODY (ROUTINE TESTING W REFLEX): HIV Screen 4th Generation wRfx: NONREACTIVE

## 2016-06-08 LAB — RPR: RPR Ser Ql: NONREACTIVE

## 2016-06-08 MED ORDER — ONDANSETRON HCL 4 MG/2ML IJ SOLN: 4.0000 mg | Freq: Four times a day (QID) | INTRAMUSCULAR | Status: DC | PRN

## 2016-06-08 MED ORDER — LACTATED RINGERS IV SOLN
INTRAVENOUS | Status: DC
  Administered 2016-06-08 – 2016-06-09 (×4): via INTRAVENOUS

## 2016-06-08 MED ORDER — OXYTOCIN BOLUS FROM INFUSION: 500.0000 mL | Freq: Once | INTRAVENOUS | Status: DC

## 2016-06-08 MED ORDER — FENTANYL CITRATE (PF) 100 MCG/2ML IJ SOLN
100.0000 ug | Freq: Once | INTRAMUSCULAR | Status: AC
  Administered 2016-06-08: 100 ug via INTRAVENOUS

## 2016-06-08 MED ORDER — SOD CITRATE-CITRIC ACID 500-334 MG/5ML PO SOLN
30.0000 mL | ORAL | Status: DC | PRN
  Administered 2016-06-09: 30 mL via ORAL
  Filled 2016-06-08: qty 15

## 2016-06-08 MED ORDER — OXYTOCIN 40 UNITS IN LACTATED RINGERS INFUSION - SIMPLE MED
1.0000 m[IU]/min | INTRAVENOUS | Status: DC
  Administered 2016-06-08: 1 m[IU]/min via INTRAVENOUS
  Filled 2016-06-08: qty 1000

## 2016-06-08 MED ORDER — FENTANYL CITRATE (PF) 100 MCG/2ML IJ SOLN
50.0000 ug | INTRAMUSCULAR | Status: DC | PRN
  Administered 2016-06-08: 100 ug via INTRAVENOUS
  Filled 2016-06-08 (×3): qty 2

## 2016-06-08 MED ORDER — OXYCODONE-ACETAMINOPHEN 5-325 MG PO TABS: 2.0000 | ORAL_TABLET | ORAL | Status: DC | PRN

## 2016-06-08 MED ORDER — LACTATED RINGERS IV SOLN
500.0000 mL | INTRAVENOUS | Status: DC | PRN
  Administered 2016-06-08: 500 mL via INTRAVENOUS
  Administered 2016-06-08: 1000 mL via INTRAVENOUS

## 2016-06-08 MED ORDER — OXYTOCIN 40 UNITS IN LACTATED RINGERS INFUSION - SIMPLE MED: 2.5000 [IU]/h | INTRAVENOUS | Status: DC

## 2016-06-08 MED ORDER — OXYCODONE-ACETAMINOPHEN 5-325 MG PO TABS: 1.0000 | ORAL_TABLET | ORAL | Status: DC | PRN

## 2016-06-08 MED ORDER — LIDOCAINE HCL (PF) 1 % IJ SOLN
30.0000 mL | INTRAMUSCULAR | Status: DC | PRN
  Filled 2016-06-08: qty 30

## 2016-06-08 MED ORDER — TERBUTALINE SULFATE 1 MG/ML IJ SOLN: 0.2500 mg | Freq: Once | INTRAMUSCULAR | Status: DC | PRN

## 2016-06-08 MED ORDER — OXYTOCIN 40 UNITS IN LACTATED RINGERS INFUSION - SIMPLE MED
1.0000 m[IU]/min | INTRAVENOUS | Status: DC
  Administered 2016-06-09: 22 m[IU]/min via INTRAVENOUS
  Filled 2016-06-08: qty 1000

## 2016-06-08 MED ORDER — MISOPROSTOL 25 MCG QUARTER TABLET
25.0000 ug | ORAL_TABLET | ORAL | Status: DC
  Administered 2016-06-08: 25 ug via VAGINAL
  Filled 2016-06-08: qty 0.25

## 2016-06-08 MED ORDER — ACETAMINOPHEN 325 MG PO TABS: 650.0000 mg | ORAL_TABLET | ORAL | Status: DC | PRN

## 2016-06-08 NOTE — Progress Notes (Signed)
[name] is a 36 y.o. G1P0 at 4776w0d  admitted for induction of labor due to Post dates. Due date 06/01/16.  Subjective: pt tolerating contractions well on IV pitocin, has not had any Decelerations recently.   Objective: BP 117/75   Pulse 71   Temp 98.9 F (37.2 C) (Oral)   Resp 18   Ht 5\' 8"  (1.727 m)   Wt 99.3 kg (219 lb)   LMP 08/26/2015 (Exact Date)   BMI 33.30 kg/m  No intake/output data recorded. No intake/output data recorded.  FHT:  FHR: 145 bpm, variability: moderate,  accelerations:  Present,  decelerations:  Absent UC:   regular, every 3 minutes SVE:   Dilation: 1 Effacement (%): 70 Station: -3 Exam by:: Evelyn NewcomerStephanie [name], Evelyn [name] Exam by Evelyn [name]: 2./ 50%/-2 posterior, with presenting part quite low. Labs: Lab Results  Component Value Date   WBC 9.7 06/08/2016   HGB 10.8 (L) 06/08/2016   HCT 32.9 (L) 06/08/2016   MCV 77.0 (L) 06/08/2016   PLT 159 06/08/2016    Assessment / Plan: Induction of labor due to postterm,  progressing well on pitocin  Labor: Progressing on Pitocin, will continue to increase then AROM and Foley bulb placed in cervix by Evelyn [name], 60 cc fluid inserted. Preeclampsia:   Fetal Wellbeing:  Category I Pain Control:  IV pain meds I/D:  n/a Anticipated MOD:  NSVD  Evelyn [name] V 06/08/2016, 7:59 PM

## 2016-06-08 NOTE — Progress Notes (Signed)
Patient ID: [name], female   DOB: 08/03/1980, 36 y.o.   MRN: [id number]  S: Patient seen & examined for progress of labor. Patient comfortable in the room. She has received only 1 dose of cytotec, not feeling contractions.     O:  Vitals:   06/08/16 1214 06/08/16 1315 06/08/16 1351 06/08/16 1426  BP: 120/63 108/64  120/70  Pulse: 82 79  78  Resp: 18 18  18   Temp:   98.2 F (36.8 C)   TempSrc:   Oral   Weight:      Height:        Dilation: 1 Effacement (%): Thick Cervical Position: Posterior Station: -3 Presentation: Vertex Exam by:: Dr. Omer JackMumaw  Attempted foley bulb with speculum, but was unable to thread foley catheter through cervical os.   FHT: 160 bpm, mod var, +accels, late decels with rare contractions TOCO: Infrequent   A/P: D/C cytotec due to late decels during contractions Will give 1000cc LR bolus If decels improve, will start low dose pitocin Continue expectant management Anticipate SVD

## 2016-06-08 NOTE — Progress Notes (Signed)
S: Patient seen & examined for progress of labor. Patient comfortable with epidural.    O:  Vitals:   06/08/16 1959 06/08/16 2030 06/08/16 2105 06/08/16 2203  BP: 126/78 123/76 113/68 117/69  Pulse: 69 72 71 74  Resp: 20 18 20 18   Temp:    97.9 F (36.6 C)  TempSrc:    Oral  Weight:      Height:        Dilation: 2 Effacement (%): 50 Cervical Position: Posterior Station: -2 Presentation: Vertex Exam by:: Emelda FearFerguson MD   FHT: 130s bpm, mod var, +accels, occasional variable decels TOCO: q2-673min   A/P: Pitocin at 14 Foley bulb still in place Continue expectant management Anticipate SVD   Evelyn HarmsMegan Amoria Mclees, MD PGY-2 06/08/2016 11:25 PM

## 2016-06-08 NOTE — Anesthesia Pain Management Evaluation Note (Signed)
  CRNA Pain Management Visit Note  Patient: Evelyn [name], 36 y.o., female  "Hello I am a member of the anesthesia team at [facility]. We have an anesthesia team available at all times to provide care throughout the hospital, including epidural management and anesthesia for C-section. I don't know your plan for the delivery whether it a natural birth, water birth, IV sedation, nitrous supplementation, doula or epidural, but we want to meet your pain goals."   1.Was your pain managed to your expectations on prior hospitalizations?   No prior hospitalizations  2.What is your expectation for pain management during this hospitalization?     Epidural and IV pain meds  3.How can we help you reach that goal? Be available  Record the patient's initial score and the patient's pain goal.   Pain: 3  Pain Goal: 5 [facility] wants you to be able to say your pain was always managed very well.  [facility] Care AnnexMERRITT,Evelyn [name] 06/08/2016

## 2016-06-08 NOTE — H&P (Signed)
LABOR AND DELIVERY ADMISSION HISTORY AND PHYSICAL NOTE  Fuller PlanYvette Gonzalez is a 36 y.o. female G1P0 with IUP at 5972w0d by LMP presenting for IOL for postdates. Patient denies any contractions, CP, SOB, LE edema, HA or changes in vision.  She reports positive fetal movement. She denies leakage of fluid or vaginal bleeding.  Prenatal History/Complications:  Past Medical History: History reviewed. No pertinent past medical history.  Past Surgical History: Past Surgical History:  Procedure Laterality Date  . BREAST ENHANCEMENT SURGERY    . LAPAROSCOPIC GASTRIC SLEEVE RESECTION      Obstetrical History: OB History    Gravida Para Term Preterm AB Living   1             SAB TAB Ectopic Multiple Live Births                  Social History: Social History   Social History  . Marital status: Single    Spouse name: N/A  . Number of children: N/A  . Years of education: N/A   Social History Main Topics  . Smoking status: Former Smoker    Packs/day: 0.25    Years: 10.00    Types: Cigarettes    Quit date: 2017  . Smokeless tobacco: Never Used  . Alcohol use No  . Drug use: No  . Sexual activity: Yes    Birth control/ protection: IUD   Other Topics Concern  . None   Social History Narrative  . None    Family History: Family History  Problem Relation Age of Onset  . Cancer Father   . Cancer Maternal Grandmother     Allergies: No Known Allergies  Prescriptions Prior to Admission  Medication Sig Dispense Refill Last Dose  . ferrous sulfate 325 (65 FE) MG EC tablet Take 325 mg by mouth 3 (three) times daily with meals.   Past Week at Unknown time  . Prenatal Vit-Fe Fumarate-FA (PRENATAL VITAMIN PO) Take by mouth.   06/07/2016 at Unknown time  . omeprazole (PRILOSEC) 20 MG capsule Take 1 capsule (20 mg total) by mouth 2 (two) times daily before a meal. (Patient not taking: Reported on 05/03/2016) 60 capsule 5 Not Taking     Review of Systems   All systems reviewed and  negative except as stated in HPI  Blood pressure 119/77, pulse 84, temperature 98.2 F (36.8 C), temperature source Oral, resp. rate 18, height 5\' 8"  (1.727 m), weight 99.3 kg (219 lb), last menstrual period 08/26/2015. General appearance: alert and cooperative Lungs: clear to auscultation bilaterally Heart: regular rate and rhythm Abdomen: soft, non-tender; bowel sounds normal Extremities: No calf swelling or tenderness Presentation: cephalic vertex Fetal monitoring: Baseline HR 143, mod variability, pos accels, no decels, one contraction in 45 min Uterine activity: unable to locate cervix Exam by:: dr. Myrtie Somanwarden   Prenatal labs: ABO, Rh: --/--/A POS (01/31 0810) Antibody: NEG (01/31 0810) Rubella: immune RPR: Nonreactive (07/13 0000)  HBsAg: Negative (07/13 0000)  HIV: Non-reactive (07/13 0000)  GBS: Negative (12/26 1600)  1 hr Glucola: 1hr 77 Genetic screening:  normal Anatomy US: normal  Prenatal Transfer Tool  Maternal Diabetes: No Genetic Screening: Normal Maternal Ultrasounds/Referrals: Normal Fetal Ultrasounds or other Referrals:  None Maternal Substance Abuse:  No Significant Maternal Medications:  None Significant Maternal Lab Results: None  Results for orders placed or performed during the hospital encounter of 06/08/16 (from the past 24 hour(s))  CBC   Collection Time: 06/08/16  8:10 AM  Result Value  Ref Range   WBC 9.7 4.0 - 10.5 K/uL   RBC 4.27 3.87 - 5.11 MIL/uL   Hemoglobin 10.8 (L) 12.0 - 15.0 g/dL   HCT 91.4 (L) 78.2 - 95.6 %   MCV 77.0 (L) 78.0 - 100.0 fL   MCH 25.3 (L) 26.0 - 34.0 pg   MCHC 32.8 30.0 - 36.0 g/dL   RDW 21.3 08.6 - 57.8 %   Platelets 159 150 - 400 K/uL  Type and screen [facility]   Collection Time: 06/08/16  8:10 AM  Result Value Ref Range   ABO/RH(D) A POS    Antibody Screen NEG    Sample Expiration 06/11/2016     Patient Active Problem List   Diagnosis Date Noted  . Post term pregnancy 06/08/2016  .  History of gastric bypass 04/25/2016  . Gastroesophageal reflux in pregnancy in third trimester 04/25/2016  . Anemia during pregnancy in third trimester 04/25/2016  . Supervision of normal first pregnancy in third trimester 03/21/2016  . Herpes simplex virus (HSV) infection 03/21/2016    Assessment: Johnnisha Forton is a 36 y.o. G1P0 at [redacted]w[redacted]d here for IOL for postdates. No complications during pregnancy.  Reported to be 1cm dilated in office, but on exam was more closed to 0.5cm.    #Labor: IOL with cytotec and will attempt to place foley bulb and eventually start pit and anticipate SVD #Pain: epidural #FWB:  Cat I #ID: GBS neg #MOF:  both #MOC: IUD  Renne Musca, MD PGY-1 06/08/2016, 9:39 AM   OB FELLOW HISTORY AND PHYSICAL ATTESTATION  I have seen and examined this patient; I agree with above documentation in the resident's note. Unable to place a foley bulb, patient cervix is very posterior, thick, and barely fingertip. Will start with cytotec and attempt foley bulb later.    Jen Mow, DO OB Fellow 06/08/2016, 11:04 AM

## 2016-06-08 NOTE — Anesthesia Pain Management Evaluation Note (Signed)
  CRNA Pain Management Visit Note  Patient: Evelyn [name], 36 y.o., female  "Hello I am a [facility] of the anesthesia [facility]. We have an anesthesia team available at all times to provide care throughout the hospital, including epidural management and anesthesia for C-section. I don't know your plan for the delivery whether it a natural birth, water birth, IV sedation, nitrous supplementation, doula or epidural, but we want to meet your pain goals."   1.Was your pain managed to your expectations on prior hospitalizations?   No prior hospitalizations  2.What is your expectation for pain management during this hospitalization?     Epidural  3.How can we help you reach that goal? epidural  Record the patient's initial score and the patient's pain goal.   Pain: 0  Pain Goal: 7 [facility] wants you to be able to say your pain was always managed very well.  Caspar Favila 06/08/2016

## 2016-06-09 ENCOUNTER — Inpatient Hospital Stay (HOSPITAL_COMMUNITY): Payer: Medicaid Other | Admitting: Anesthesiology

## 2016-06-09 ENCOUNTER — Encounter (HOSPITAL_COMMUNITY): Payer: Self-pay

## 2016-06-09 ENCOUNTER — Encounter (HOSPITAL_COMMUNITY)
Admission: RE | Disposition: A | Discharge status: Home / Self Care | Payer: Self-pay | Source: Ambulatory Visit | Attending: Obstetrics and Gynecology

## 2016-06-09 DIAGNOSIS — O480 Post-term pregnancy: Secondary | ICD-10-CM

## 2016-06-09 DIAGNOSIS — Z3A41 41 weeks gestation of pregnancy: Secondary | ICD-10-CM

## 2016-06-09 SURGERY — Surgical Case
Anesthesia: Epidural

## 2016-06-09 MED ORDER — PHENYLEPHRINE 40 MCG/ML (10ML) SYRINGE FOR IV PUSH (FOR BLOOD PRESSURE SUPPORT): 80.0000 ug | PREFILLED_SYRINGE | INTRAVENOUS | Status: DC | PRN

## 2016-06-09 MED ORDER — SENNOSIDES-DOCUSATE SODIUM 8.6-50 MG PO TABS
2.0000 | ORAL_TABLET | ORAL | Status: DC
  Administered 2016-06-10 (×2): 2 via ORAL
  Filled 2016-06-09 (×2): qty 2

## 2016-06-09 MED ORDER — ONDANSETRON HCL 4 MG/2ML IJ SOLN
INTRAMUSCULAR | Status: AC
  Filled 2016-06-09: qty 2

## 2016-06-09 MED ORDER — OXYCODONE HCL 5 MG PO TABS
5.0000 mg | ORAL_TABLET | ORAL | Status: DC | PRN
  Administered 2016-06-11 (×2): 5 mg via ORAL
  Filled 2016-06-09 (×2): qty 1

## 2016-06-09 MED ORDER — ONDANSETRON HCL 4 MG/2ML IJ SOLN
INTRAMUSCULAR | Status: DC | PRN
  Administered 2016-06-09: 4 mg via INTRAVENOUS

## 2016-06-09 MED ORDER — OXYTOCIN 10 UNIT/ML IJ SOLN
INTRAVENOUS | Status: DC | PRN
  Administered 2016-06-09: 40 [IU] via INTRAVENOUS

## 2016-06-09 MED ORDER — TETANUS-DIPHTH-ACELL PERTUSSIS 5-2.5-18.5 LF-MCG/0.5 IM SUSP: 0.5000 mL | Freq: Once | INTRAMUSCULAR | Status: DC

## 2016-06-09 MED ORDER — OXYCODONE HCL 5 MG/5ML PO SOLN: 5.0000 mg | Freq: Once | ORAL | Status: DC | PRN

## 2016-06-09 MED ORDER — NALBUPHINE HCL 10 MG/ML IJ SOLN: 5.0000 mg | Freq: Once | INTRAMUSCULAR | Status: DC | PRN

## 2016-06-09 MED ORDER — OXYCODONE HCL 5 MG PO TABS: 10.0000 mg | ORAL_TABLET | ORAL | Status: DC | PRN

## 2016-06-09 MED ORDER — EPHEDRINE 5 MG/ML INJ: 10.0000 mg | INTRAVENOUS | Status: DC | PRN

## 2016-06-09 MED ORDER — FENTANYL 2.5 MCG/ML BUPIVACAINE 1/10 % EPIDURAL INFUSION (WH - ANES)
14.0000 mL/h | INTRAMUSCULAR | Status: DC | PRN
  Administered 2016-06-09 (×2): 14 mL/h via EPIDURAL
  Filled 2016-06-09: qty 100

## 2016-06-09 MED ORDER — SCOPOLAMINE 1 MG/3DAYS TD PT72: 1.0000 | MEDICATED_PATCH | Freq: Once | TRANSDERMAL | Status: DC

## 2016-06-09 MED ORDER — OXYCODONE HCL 5 MG PO TABS: 5.0000 mg | ORAL_TABLET | Freq: Once | ORAL | Status: DC | PRN

## 2016-06-09 MED ORDER — DIPHENHYDRAMINE HCL 25 MG PO CAPS
25.0000 mg | ORAL_CAPSULE | ORAL | Status: DC | PRN
Start: 2016-06-09 — End: 2016-06-11

## 2016-06-09 MED ORDER — DIPHENHYDRAMINE HCL 25 MG PO CAPS: 25.0000 mg | ORAL_CAPSULE | Freq: Four times a day (QID) | ORAL | Status: DC | PRN

## 2016-06-09 MED ORDER — LACTATED RINGERS IV SOLN: 500.0000 mL | Freq: Once | INTRAVENOUS | Status: DC

## 2016-06-09 MED ORDER — OXYTOCIN 10 UNIT/ML IJ SOLN
INTRAMUSCULAR | Status: AC
  Filled 2016-06-09: qty 4

## 2016-06-09 MED ORDER — OXYTOCIN 40 UNITS IN LACTATED RINGERS INFUSION - SIMPLE MED: 2.5000 [IU]/h | INTRAVENOUS | Status: AC

## 2016-06-09 MED ORDER — SIMETHICONE 80 MG PO CHEW
80.0000 mg | CHEWABLE_TABLET | Freq: Three times a day (TID) | ORAL | Status: DC
  Administered 2016-06-10 – 2016-06-11 (×3): 80 mg via ORAL
  Filled 2016-06-09 (×4): qty 1

## 2016-06-09 MED ORDER — KETOROLAC TROMETHAMINE 30 MG/ML IJ SOLN: 30.0000 mg | Freq: Four times a day (QID) | INTRAMUSCULAR | Status: AC | PRN

## 2016-06-09 MED ORDER — DIPHENHYDRAMINE HCL 50 MG/ML IJ SOLN: 12.5000 mg | INTRAMUSCULAR | Status: DC | PRN

## 2016-06-09 MED ORDER — LACTATED RINGERS IV SOLN
500.0000 mL | Freq: Once | INTRAVENOUS | Status: DC
Start: 2016-06-09 — End: 2016-06-09
  Administered 2016-06-09: 500 mL via INTRAVENOUS

## 2016-06-09 MED ORDER — SODIUM BICARBONATE 8.4 % IV SOLN
INTRAVENOUS | Status: DC | PRN
  Administered 2016-06-09: 3 mL via EPIDURAL
  Administered 2016-06-09: 5 mL via EPIDURAL
  Administered 2016-06-09: 2 mL via EPIDURAL
  Administered 2016-06-09: 7 mL via EPIDURAL

## 2016-06-09 MED ORDER — MORPHINE SULFATE (PF) 0.5 MG/ML IJ SOLN
INTRAMUSCULAR | Status: DC | PRN
  Administered 2016-06-09: 4 mg via EPIDURAL

## 2016-06-09 MED ORDER — CEFAZOLIN SODIUM-DEXTROSE 2-4 GM/100ML-% IV SOLN
2.0000 g | Freq: Once | INTRAVENOUS | Status: AC
  Administered 2016-06-09: 2 g via INTRAVENOUS
  Filled 2016-06-09: qty 100

## 2016-06-09 MED ORDER — NALBUPHINE HCL 10 MG/ML IJ SOLN: 5.0000 mg | INTRAMUSCULAR | Status: DC | PRN

## 2016-06-09 MED ORDER — ZOLPIDEM TARTRATE 5 MG PO TABS: 5.0000 mg | ORAL_TABLET | Freq: Every evening | ORAL | Status: DC | PRN

## 2016-06-09 MED ORDER — SCOPOLAMINE 1 MG/3DAYS TD PT72
MEDICATED_PATCH | TRANSDERMAL | Status: AC
  Filled 2016-06-09: qty 1

## 2016-06-09 MED ORDER — MEPERIDINE HCL 25 MG/ML IJ SOLN: 6.2500 mg | INTRAMUSCULAR | Status: DC | PRN

## 2016-06-09 MED ORDER — SCOPOLAMINE 1 MG/3DAYS TD PT72
MEDICATED_PATCH | TRANSDERMAL | Status: DC | PRN
  Administered 2016-06-09: 1 via TRANSDERMAL

## 2016-06-09 MED ORDER — DIBUCAINE 1 % RE OINT: 1.0000 "application " | TOPICAL_OINTMENT | RECTAL | Status: DC | PRN

## 2016-06-09 MED ORDER — LACTATED RINGERS IV SOLN
INTRAVENOUS | Status: DC | PRN
  Administered 2016-06-09 (×2): via INTRAVENOUS

## 2016-06-09 MED ORDER — ONDANSETRON HCL 4 MG/2ML IJ SOLN: 4.0000 mg | Freq: Three times a day (TID) | INTRAMUSCULAR | Status: DC | PRN

## 2016-06-09 MED ORDER — KETOROLAC TROMETHAMINE 30 MG/ML IJ SOLN
INTRAMUSCULAR | Status: DC | PRN
  Administered 2016-06-09: 30 mg via INTRAVENOUS

## 2016-06-09 MED ORDER — NALOXONE HCL 2 MG/2ML IJ SOSY: 1.0000 ug/kg/h | PREFILLED_SYRINGE | INTRAVENOUS | Status: DC | PRN

## 2016-06-09 MED ORDER — COCONUT OIL OIL
1.0000 | TOPICAL_OIL | Status: DC | PRN
Start: 2016-06-09 — End: 2016-06-11
  Filled 2016-06-09: qty 120

## 2016-06-09 MED ORDER — LACTATED RINGERS IV SOLN
INTRAVENOUS | Status: DC
  Administered 2016-06-10: 01:00:00 via INTRAVENOUS

## 2016-06-09 MED ORDER — SODIUM CHLORIDE 0.9 % IR SOLN
Status: DC | PRN
  Administered 2016-06-09: 1

## 2016-06-09 MED ORDER — MORPHINE SULFATE (PF) 0.5 MG/ML IJ SOLN
INTRAMUSCULAR | Status: AC
  Filled 2016-06-09: qty 10

## 2016-06-09 MED ORDER — IBUPROFEN 600 MG PO TABS
600.0000 mg | ORAL_TABLET | Freq: Four times a day (QID) | ORAL | Status: DC
  Administered 2016-06-10 – 2016-06-11 (×7): 600 mg via ORAL
  Filled 2016-06-09 (×7): qty 1

## 2016-06-09 MED ORDER — FENTANYL 2.5 MCG/ML BUPIVACAINE 1/10 % EPIDURAL INFUSION (WH - ANES)
INTRAMUSCULAR | Status: AC
  Filled 2016-06-09: qty 100

## 2016-06-09 MED ORDER — SODIUM BICARBONATE 8.4 % IV SOLN
INTRAVENOUS | Status: AC
  Filled 2016-06-09: qty 50

## 2016-06-09 MED ORDER — SIMETHICONE 80 MG PO CHEW: 80.0000 mg | CHEWABLE_TABLET | ORAL | Status: DC | PRN

## 2016-06-09 MED ORDER — EPHEDRINE 5 MG/ML INJ
INTRAVENOUS | Status: AC
  Filled 2016-06-09: qty 10

## 2016-06-09 MED ORDER — SIMETHICONE 80 MG PO CHEW
80.0000 mg | CHEWABLE_TABLET | ORAL | Status: DC
  Administered 2016-06-10 (×2): 80 mg via ORAL
  Filled 2016-06-09 (×2): qty 1

## 2016-06-09 MED ORDER — MENTHOL 3 MG MT LOZG: 1.0000 | LOZENGE | OROMUCOSAL | Status: DC | PRN

## 2016-06-09 MED ORDER — DEXAMETHASONE SODIUM PHOSPHATE 4 MG/ML IJ SOLN
INTRAMUSCULAR | Status: DC | PRN
  Administered 2016-06-09: 4 mg via INTRAVENOUS

## 2016-06-09 MED ORDER — LIDOCAINE HCL (PF) 1 % IJ SOLN
INTRAMUSCULAR | Status: DC | PRN
  Administered 2016-06-09: 4 mL via EPIDURAL

## 2016-06-09 MED ORDER — SODIUM CHLORIDE 0.9% FLUSH: 3.0000 mL | INTRAVENOUS | Status: DC | PRN

## 2016-06-09 MED ORDER — PHENYLEPHRINE 40 MCG/ML (10ML) SYRINGE FOR IV PUSH (FOR BLOOD PRESSURE SUPPORT)
PREFILLED_SYRINGE | INTRAVENOUS | Status: AC
  Filled 2016-06-09: qty 10

## 2016-06-09 MED ORDER — LIDOCAINE-EPINEPHRINE (PF) 2 %-1:200000 IJ SOLN
INTRAMUSCULAR | Status: AC
  Filled 2016-06-09: qty 20

## 2016-06-09 MED ORDER — LACTATED RINGERS IV SOLN
INTRAVENOUS | Status: DC | PRN
  Administered 2016-06-09: 18:00:00 via INTRAVENOUS

## 2016-06-09 MED ORDER — ACETAMINOPHEN 325 MG PO TABS
650.0000 mg | ORAL_TABLET | ORAL | Status: DC | PRN
  Administered 2016-06-10 – 2016-06-11 (×2): 650 mg via ORAL
  Filled 2016-06-09 (×2): qty 2

## 2016-06-09 MED ORDER — NALOXONE HCL 0.4 MG/ML IJ SOLN: 0.4000 mg | INTRAMUSCULAR | Status: DC | PRN

## 2016-06-09 MED ORDER — WITCH HAZEL-GLYCERIN EX PADS: 1.0000 "application " | MEDICATED_PAD | CUTANEOUS | Status: DC | PRN

## 2016-06-09 MED ORDER — ONDANSETRON HCL 4 MG/2ML IJ SOLN: 4.0000 mg | Freq: Four times a day (QID) | INTRAMUSCULAR | Status: DC | PRN

## 2016-06-09 MED ORDER — PHENYLEPHRINE 40 MCG/ML (10ML) SYRINGE FOR IV PUSH (FOR BLOOD PRESSURE SUPPORT)
PREFILLED_SYRINGE | INTRAVENOUS | Status: AC
  Filled 2016-06-09: qty 20

## 2016-06-09 MED ORDER — PRENATAL MULTIVITAMIN CH
1.0000 | ORAL_TABLET | Freq: Every day | ORAL | Status: DC
  Administered 2016-06-10 – 2016-06-11 (×2): 1 via ORAL
  Filled 2016-06-09 (×2): qty 1

## 2016-06-09 SURGICAL SUPPLY — 42 items
BENZOIN TINCTURE PRP APPL 2/3 (GAUZE/BANDAGES/DRESSINGS) ×3 IMPLANT
CHLORAPREP W/TINT 26ML (MISCELLANEOUS) ×3 IMPLANT
CLAMP CORD UMBIL (MISCELLANEOUS) IMPLANT
CLOSURE STERI STRIP 1/2 X4 (GAUZE/BANDAGES/DRESSINGS) ×3 IMPLANT
CLOTH BEACON ORANGE TIMEOUT ST (SAFETY) ×3 IMPLANT
DRAPE C SECTION CLR SCREEN (DRAPES) ×3 IMPLANT
DRSG OPSITE POSTOP 4X10 (GAUZE/BANDAGES/DRESSINGS) ×3 IMPLANT
ELECT REM PT RETURN 9FT ADLT (ELECTROSURGICAL) ×3
ELECTRODE REM PT RTRN 9FT ADLT (ELECTROSURGICAL) ×1 IMPLANT
EXTRACTOR VACUUM M CUP 4 TUBE (SUCTIONS) IMPLANT
EXTRACTOR VACUUM M CUP 4' TUBE (SUCTIONS)
GLOVE BIO SURGEON STRL SZ7.5 (GLOVE) ×3 IMPLANT
GLOVE BIOGEL PI IND STRL 7.0 (GLOVE) ×1 IMPLANT
GLOVE BIOGEL PI INDICATOR 7.0 (GLOVE) ×2
GOWN STRL REUS W/TWL 2XL LVL3 (GOWN DISPOSABLE) ×3 IMPLANT
GOWN STRL REUS W/TWL LRG LVL3 (GOWN DISPOSABLE) ×6 IMPLANT
KIT ABG SYR 3ML LUER SLIP (SYRINGE) IMPLANT
NEEDLE HYPO 22GX1.5 SAFETY (NEEDLE) ×3 IMPLANT
NEEDLE HYPO 25X5/8 SAFETYGLIDE (NEEDLE) IMPLANT
NS IRRIG 1000ML POUR BTL (IV SOLUTION) ×3 IMPLANT
PACK C SECTION WH (CUSTOM PROCEDURE TRAY) ×3 IMPLANT
PAD ABD 7.5X8 STRL (GAUZE/BANDAGES/DRESSINGS) ×3 IMPLANT
PAD OB MATERNITY 4.3X12.25 (PERSONAL CARE ITEMS) ×3 IMPLANT
PENCIL SMOKE EVAC W/HOLSTER (ELECTROSURGICAL) ×3 IMPLANT
RTRCTR C-SECT PINK 25CM LRG (MISCELLANEOUS) ×3 IMPLANT
SPONGE GAUZE 4X4 12PLY STER LF (GAUZE/BANDAGES/DRESSINGS) ×6 IMPLANT
STRIP CLOSURE SKIN 1/2X4 (GAUZE/BANDAGES/DRESSINGS) ×2 IMPLANT
SUT CHROMIC 1 CTX 36 (SUTURE) ×9 IMPLANT
SUT VIC AB 1 CT1 27 (SUTURE) ×4
SUT VIC AB 1 CT1 27XBRD ANTBC (SUTURE) ×2 IMPLANT
SUT VIC AB 2-0 CT1 (SUTURE) ×3 IMPLANT
SUT VIC AB 2-0 CT1 27 (SUTURE) ×2
SUT VIC AB 2-0 CT1 TAPERPNT 27 (SUTURE) ×1 IMPLANT
SUT VIC AB 3-0 CT1 27 (SUTURE) ×4
SUT VIC AB 3-0 CT1 TAPERPNT 27 (SUTURE) ×2 IMPLANT
SUT VIC AB 3-0 SH 27 (SUTURE)
SUT VIC AB 3-0 SH 27X BRD (SUTURE) IMPLANT
SUT VIC AB 4-0 KS 27 (SUTURE) ×3 IMPLANT
SYR BULB IRRIGATION 50ML (SYRINGE) ×3 IMPLANT
SYR CONTROL 10ML LL (SYRINGE) ×3 IMPLANT
TOWEL OR 17X24 6PK STRL BLUE (TOWEL DISPOSABLE) ×3 IMPLANT
TRAY FOLEY CATH SILVER 14FR (SET/KITS/TRAYS/PACK) ×3 IMPLANT

## 2016-06-09 NOTE — Progress Notes (Signed)
S: Patient seen & examined for progress of labor. Patient comfortable with epidural.    O:  Vitals:   06/09/16 0431 06/09/16 0437 06/09/16 0503 06/09/16 0531  BP: (!) 97/48 (!) 97/52 (!) 91/49 106/65  Pulse: (!) 55 61 (!) 59 64  Resp:      Temp:      TempSrc:      SpO2:      Weight:      Height:        Dilation: 6.5 Effacement (%): 90 Cervical Position: Middle Station: 0 Presentation: Vertex Exam by:: L Straight RN   FHT: 130sbpm, mod var, +accels, occasional variable decels TOCO: q2-823min   A/P: AROM with small amount of clear fluid Continue pitocin Continue expectant management Anticipate SVD   Gorden HarmsMegan Miklos Bidinger, MD PGY-2 06/09/2016 6:30 AM

## 2016-06-09 NOTE — Progress Notes (Signed)
S: Patient seen & examined for progress of labor. Patient comfortable with epidural.    O:  Vitals:   06/09/16 0201 06/09/16 0231 06/09/16 0301 06/09/16 0308  BP: 123/80 117/71 (!) 90/48 (!) 100/55  Pulse: 71 62 (!) 54 (!) 53  Resp: 20 20    Temp:      TempSrc:      SpO2:      Weight:      Height:        Dilation: 6 Effacement (%): 90 Cervical Position: Middle Station: 0 Presentation: Vertex Exam by:: Celedonio SavageE. Davis RN   FHT: 140s bpm, mod var, +accels, late decels TOCO: q2-3 min   A/P: Foley bulb out around 00:15 Continue with pitocin at this time Will consider cutting pitocin back if decels continue Continue expectant management otherwise Anticipate SVD   Gorden HarmsMegan Raahim Shartzer, MD PGY-2 06/09/2016 3:50 AM

## 2016-06-09 NOTE — Anesthesia Preprocedure Evaluation (Signed)
Anesthesia Evaluation  Patient identified by MRN, date of birth, ID band Patient awake    Reviewed: Allergy & Precautions, Patient's Chart, lab work & pertinent test results  Airway Mallampati: II       Dental  (+) Teeth Intact   Pulmonary former smoker,    breath sounds clear to auscultation       Cardiovascular negative cardio ROS   Rhythm:Regular Rate:Normal     Neuro/Psych negative neurological ROS  negative psych ROS   GI/Hepatic negative GI ROS, Neg liver ROS,   Endo/Other  negative endocrine ROS  Renal/GU negative Renal ROS  negative genitourinary   Musculoskeletal negative musculoskeletal ROS (+)   Abdominal   Peds negative pediatric ROS (+)  Hematology negative hematology ROS (+)   Anesthesia Other Findings   Reproductive/Obstetrics (+) Pregnancy                             Lab Results  Component Value Date   WBC 9.7 06/08/2016   HGB 10.8 (L) 06/08/2016   HCT 32.9 (L) 06/08/2016   MCV 77.0 (L) 06/08/2016   PLT 159 06/08/2016     Anesthesia Physical Anesthesia Plan  ASA: II  Anesthesia Plan: Epidural   Post-op Pain Management:    Induction:   Airway Management Planned:   Additional Equipment:   Intra-op Plan:   Post-operative Plan:   Informed Consent: I have reviewed the patients History and Physical, chart, labs and discussed the procedure including the risks, benefits and alternatives for the proposed anesthesia with the patient or authorized representative who has indicated his/her understanding and acceptance.     Plan Discussed with:   Anesthesia Plan Comments:         Anesthesia Quick Evaluation

## 2016-06-09 NOTE — Lactation Note (Signed)
This note was copied from a baby's chart. Lactation Consultation Note  Patient Name: Evelyn [name] ZOXWR'UToday's Date: 06/09/2016 Reason for consult: Initial assessment Baby at 4 hr of life. Mom desires to offer the breast and formula. She would like to start pumping and bottle feeding but is ok with latching right now. She has firm breast with erect nipples. She has an augmentation 5275yr ago. She had positive breast changes during pregnancy. She has bilateral nipple bruising and blisters. Given comfort gels. Baby has a bubble palate, noticeable lingual frenulum with a posterior insertion point, can extend tongue over gum ridge, has good lateralization of tongue, and nice peristolic tongue movement. Baby can flange lips. She has a dark pink crease that almost looks like a scar on the upper lip. Discussed baby behavior, feeding frequency, baby belly size, voids, wt loss, breast changes, and nipple care. Demonstrated manual expression, colostrum noted bilaterally, spoon in room. Set up DEBP for extra stimulation after bf. Given curved tip syringe so that parents can feed back any drops that mom might get. Given lactation handouts. Aware of OP services and support group.     Maternal Data Has patient been taught Hand Expression?: Yes Does the patient have breastfeeding experience prior to this delivery?: No  Feeding Feeding Type: Breast Fed Length of feed: 15 min  LATCH Score/Interventions Latch: Grasps breast easily, tongue down, lips flanged, rhythmical sucking.  Audible Swallowing: A few with stimulation Intervention(s): Hand expression;Skin to skin Intervention(s): Alternate breast massage  Type of Nipple: Everted at rest and after stimulation Intervention(s): No intervention needed  Comfort (Breast/Nipple): Filling, red/small blisters or bruises, mild/mod discomfort  Problem noted: Mild/Moderate discomfort;Cracked, bleeding, blisters, bruises Interventions   (Cracked/bleeding/bruising/blister): Expressed breast milk to nipple Interventions (Mild/moderate discomfort): Comfort gels  Hold (Positioning): Assistance needed to correctly position infant at breast and maintain latch. Intervention(s): Position options;Support Pillows  LATCH Score: 7  Lactation Tools Discussed/Used Pump Review: Setup, frequency, and cleaning;Milk Storage;Other (comment) (pump settings) Initiated by:: ES Date initiated:: 06/09/16   Consult Status Consult Status: Follow-up Date: 06/10/16 Follow-up type: In-patient    Rulon Eisenmengerlizabeth E Kasim Mccorkle 06/09/2016, 10:08 PM

## 2016-06-09 NOTE — Consult Note (Signed)
Neonatology Note:   Attendance at C-section:    I was asked by Dr. Alysia PennaErvin to attend this primary C/S at term after failure to descend. The mother is a G1, GBS negative with good prenatal care. IOL for post dates. ROM 12 hours before delivery, fluid clear. +Nuchal.  Infant initially vigorous with good spontaneous cry attempt and tone then lack thereof.  Cord subsequently cut and infant brought to warmer.  Aggressive stimulation, warming and bulb suctioning relieved amniotic fluid obstruction and she in then continued to transition well. Ap 5/9. Lungs clear to ausc in DR. To CN to care of Pediatrician.  Dineen Kidavid C. Leary RocaEhrmann, MD

## 2016-06-09 NOTE — Anesthesia Procedure Notes (Signed)
Epidural Patient location during procedure: OB Start time: 06/09/2016 12:59 AM End time: 06/09/2016 1:03 AM  Staffing Anesthesiologist: Shona SimpsonHOLLIS, Kalanie Fewell D Performed: anesthesiologist   Preanesthetic Checklist Completed: patient identified, site marked, surgical consent, pre-op evaluation, timeout performed, IV checked, risks and benefits discussed and monitors and equipment checked  Epidural Patient position: sitting Prep: ChloraPrep Patient monitoring: heart rate, continuous pulse ox and blood pressure Approach: midline Location: L3-L4 Injection technique: LOR saline  Needle:  Needle type: Tuohy  Needle gauge: 17 G Needle length: 9 cm Catheter type: closed end flexible Catheter size: 20 Guage Test dose: negative and 1.5% lidocaine  Assessment Events: blood not aspirated, injection not painful, no injection resistance and no paresthesia  Additional Notes LOR @ 5.5  Patient identified. Risks/Benefits/Options discussed with patient including but not limited to bleeding, infection, nerve damage, paralysis, failed block, incomplete pain control, headache, blood pressure changes, nausea, vomiting, reactions to medications, itching and postpartum back pain. Confirmed with bedside nurse the patient's most recent platelet count. Confirmed with patient that they are not currently taking any anticoagulation, have any bleeding history or any family history of bleeding disorders. Patient expressed understanding and wished to proceed. All questions were answered. Sterile technique was used throughout the entire procedure. Please see nursing notes for vital signs. Test dose was given through epidural catheter and negative prior to continuing to dose epidural or start infusion. Warning signs of high block given to the patient including shortness of breath, tingling/numbness in hands, complete motor block, or any concerning symptoms with instructions to call for help. Patient was given instructions on  fall risk and not to get out of bed. All questions and concerns addressed with instructions to call with any issues or inadequate analgesia.    Reason for block:procedure for pain

## 2016-06-09 NOTE — Transfer of Care (Signed)
Immediate Anesthesia Transfer of Care Note  Patient: Evelyn [name]  Procedure(s) Performed: Procedure(s): CESAREAN SECTION (N/A)  Patient Location: PACU  Anesthesia Type:Epidural  Level of Consciousness: awake, alert  and oriented  Airway & Oxygen Therapy: Patient Spontanous Breathing  Post-op Assessment: Report given to RN and Post -op Vital signs reviewed and stable  Post vital signs: Reviewed and stable  Last Vitals:  Vitals:   06/09/16 1701 06/09/16 1728  BP: 120/68   Pulse: 80   Resp: 20 18  Temp:      Last Pain:  Vitals:   06/09/16 1701  TempSrc:   PainSc: 0-No pain      Patients Stated Pain Goal: 7 (06/08/16 0819)  Complications: No apparent anesthesia complications

## 2016-06-09 NOTE — Progress Notes (Signed)
Patient seen. She had been pushing for about 2 hours with no progress. With about another 2 hours of laboring down. Patient is in OP presentation with questionable brow presentation. Attempts by myself and Dr. Alysia PennaErvin to rotate have failed. Pateitn agrees to preoceed with c-section.  The risks of cesarean section were discussed with the patient including but were not limited to: bleeding which may require transfusion or reoperation; infection which may require antibiotics; injury to bowel, bladder, ureters or other surrounding organs; injury to the fetus; need for additional procedures including hysterectomy in the event of a life-threatening hemorrhage; placental abnormalities wth subsequent pregnancies, incisional problems, thromboembolic phenomenon and other postoperative/anesthesia complications.  The patient concurred with the proposed plan, giving informed written consent for the procedures.  Patient has been NPO for 12 hours and she will remain NPO for procedure. Anesthesia and OR aware.  Preoperative prophylactic antibiotics and SCDs ordered on call to the OR.  To OR when ready.  Ernestina PennaNicholas Sriyan Cutting, MD 06/09/2016

## 2016-06-09 NOTE — Progress Notes (Signed)
Patient seen doing well. Comfortable. Category 1 tracing. AROM for thin meconium. 9cm/100/+1

## 2016-06-09 NOTE — Op Note (Signed)
Cesarean Section Procedure Note  06/08/2016 - 06/09/2016  7:30 PM  PATIENT:  Evelyn [name]  36 y.o. female  PRE-OPERATIVE DIAGNOSIS:  primary c/s for arrest of descent and persistent op position  POST-OPERATIVE DIAGNOSIS:  primary c/s for arrest of descent and persistent op position  PROCEDURE:  Procedure(s): CESAREAN SECTION (N/A)  SURGEON:  Surgeon(s) and Role:    * Hermina StaggersMichael L Ervin, MD - Primary    * Lorne SkeensNicholas Michael Tylene Quashie, MD - Fellow  ASSISTANTS: none   ANESTHESIA:   spinal  EBL:  No intake/output data recorded.  BLOOD ADMINISTERED:none  DRAINS: none   LOCAL MEDICATIONS USED:  NONE  SPECIMEN:  No Specimen  DISPOSITION OF SPECIMEN:  N/A   Procedure Details   The patient was seen in the Holding Room. The risks, benefits, complications, treatment options, and expected outcomes were discussed with the patient.  The patient concurred with the proposed plan, giving informed consent.  The site of surgery properly noted/marked. The patient was taken to Operating Room # 9, identified as Evelyn [name] and the procedure verified as C-Section Delivery. A Time Out was held and the above information confirmed.  After induction of anesthesia, the patient was draped and prepped in the usual sterile manner. A Pfannenstiel incision was made and carried down through the subcutaneous tissue to the fascia. Fascial incision was made and extended transversely. The fascia was separated from the underlying rectus tissue superiorly and inferiorly. The peritoneum was identified and entered. Peritoneal incision was extended longitudinally. The utero-vesical peritoneal reflection was incised transversely and the bladder flap was bluntly freed from the lower uterine segment. A low transverse uterine incision was made. Delivered from cephalic presentation was a Female with Apgar scores of 5 at one minute and 9 at five minutes. After the umbilical cord was clamped and cut cord blood was obtained for  evaluation. The placenta was removed intact and appeared normal. The uterine outline, tubes and ovaries appeared normal. The uterine incision was closed with running locked sutures of Chromic. A second imbricating layer of the same was placed. Several figure of eight sutures were placed to obtain hemostasis. The bladder flap was closed with 2-0 vicryl. Lavage was carried out until clear. The fascia was then reapproximated with running sutures of Vicryl Rapide. The subcutaneous layer was closed with 3-0 vircyl  The skin was reapproximated with Vicryl.  Instrument, sponge, and needle counts were correct prior the abdominal closure and at the conclusion of the case.   Complications:  None; patient tolerated the procedure well.  COUNTS:  YES  PLAN OF CARE: Admit to inpatient   PATIENT DISPOSITION:  PACU - hemodynamically stable.   Delay start of Pharmacological VTE agent (>24hrs) due to surgical blood loss or risk of bleeding: yes             Disposition: PACU - hemodynamically stable.         Condition: stable   Lorne SkeensNicholas Michael Telecia Larocque, MD 06/09/2016 7:30 PM

## 2016-06-10 LAB — CBC
HCT: 27.4 % — ABNORMAL LOW (ref 36.0–46.0)
HEMOGLOBIN: 9.1 g/dL — AB (ref 12.0–15.0)
MCH: 25.7 pg — AB (ref 26.0–34.0)
MCHC: 33.2 g/dL (ref 30.0–36.0)
MCV: 77.4 fL — ABNORMAL LOW (ref 78.0–100.0)
Platelets: 172 10*3/uL (ref 150–400)
RBC: 3.54 MIL/uL — ABNORMAL LOW (ref 3.87–5.11)
RDW: 15.2 % (ref 11.5–15.5)
WBC: 22.4 10*3/uL — ABNORMAL HIGH (ref 4.0–10.5)

## 2016-06-10 NOTE — Progress Notes (Signed)
Subjective: Postpartum Day #1: Cesarean Delivery Patient reports tolerating PO and no problems voiding. Breast and bottlefeeding; plans IUD for contraception  Objective: Vital signs in last 24 hours: Temp:  [97.6 F (36.4 C)-99.6 F (37.6 C)] 97.6 F (36.4 C) (02/02 1342) Pulse Rate:  [64-82] 74 (02/02 1342) Resp:  [13-20] 20 (02/02 1342) BP: (88-122)/(44-78) 96/57 (02/02 1342) SpO2:  [96 %-100 %] 96 % (02/02 0930)  Physical Exam:  General: alert, cooperative and no distress Lochia: appropriate Uterine Fundus: firm Incision: honeycomb intact, dry DVT Evaluation: No evidence of DVT seen on physical exam.   Recent Labs  06/08/16 0810 06/10/16 0626  HGB 10.8* 9.1*  HCT 32.9* 27.4*    Assessment/Plan: Status post Cesarean section. Doing well postoperatively.  Continue current care. Anticipate d/c in AM 06/11/16.  Cam HaiSHAW, Glen Kesinger CNM 06/10/2016, 4:23 PM

## 2016-06-10 NOTE — Lactation Note (Addendum)
This note was copied from a baby's chart. Lactation Consultation Note  Patient Name: Evelyn [name]  ZOXWR'UToday's Date: 06/10/2016   Discussed implants placed with periareolar incision approx 4 years. Mother had them placed after weight loss. Noted scar to left of midline on infant's upper lip. R nipple has abrasion on tip and left nipple is red. Mother is wearing shells.  Applied coconut oil on nipples.  Mother is not in as much pain as her nipples look. Reviewed hand expression, glistening expressed. Assisted w/ pumping with DEBP for 15-20 min.  Recommend she pump both breasts at the same time.  Suggest she pump every other feeding.    Assisted w/ latching in football on L side.  Encouraged mother to compress breast during feeding and re-latch if baby if she slips down on nipple. Did not think baby needs NS but mother was given NS last night so reviewed how to apply in case mother chooses to use. Reviewed how to apply #20 with teach back.  Provided mother also with #24NS also. Referred mother to website bfar.org for questions about breastfeeding and implants.      Maternal Data    Feeding    LATCH Score/Interventions                      Lactation Tools Discussed/Used     Consult Status      Hardie PulleyBerkelhammer, Ruth Boschen 06/10/2016, 3:15 PM

## 2016-06-10 NOTE — Lactation Note (Signed)
This note was copied from a baby's chart. Lactation Consultation Note F/u d/t nipple pain as weil as difficulty latch. Moms nipples are semi flat, cherry red, and scab to Rt. Nipple. Mom has comfort gels. Gave shells to wear in bra to assist in everting nipple and for sore nipples as well. Mom has DEBP, has pumped tonight. Mom is breast/bottle/formula. encouraged STS during feedings. Mom had baby wrapped in blankets.  Mom talks about painful latches. Fitted mom w/#16 NS, and #20 NS. Mom didn't want to BF at this time. (States had fed all ready.) encouraged not to try to BF w/NS w/o staff training her and assisting her taught application, LC feels that mom needs hands on w/NS. Mom states she will call for assistance for next feeding. Reported to RN. Patient Name: Evelyn [name] PlanYvette Gonzalez XBMWU'XToday's Date: 06/10/2016 Reason for consult: Follow-up assessment;Breast/nipple pain   Maternal Data    Feeding    LATCH Score/Interventions                      Lactation Tools Discussed/Used Tools: Shells;Pump;Lanolin;Comfort gels Shell Type: Inverted Breast pump type: Double-Electric Breast Pump   Consult Status Consult Status: Follow-up Date: 06/10/16 Follow-up type: In-patient    Charyl DancerCARVER, Cornelious Diven G 06/10/2016, 6:12 AM

## 2016-06-10 NOTE — Progress Notes (Signed)
Patient ID: Evelyn [name], female   DOB: April 10, 1981, 36 y.o.   MRN: [id number] POSTPARTUM PROGRESS NOTE  Post Operative Day 1 Subjective:Evelyn Plan  Evelyn [name] is a 36 y.o. G1P1001 5051w1d s/p PTLCs for failure to descend and persistent op position. No acute events overnight.  Pt denies problems with ambulating, voiding or po intake.  She denies nausea or vomiting.  Pain is well controlled.  She has not had flatus. She has not had bowel movement.  Lochia id appropriate  Objective: Blood pressure (!) 88/52, pulse 70, temperature 97.7 F (36.5 C), temperature source Oral, resp. rate 16, height 5\' 8"  (1.727 m), weight 99.3 kg (219 lb), last menstrual period 08/26/2015, SpO2 98 %, unknown if currently breastfeeding.  Physical Exam:  General: alert, cooperative and no distress Lochia:normal flow Chest: CTAB Heart: RRR no m/r/g Abdomen: +BS, soft, nontender,  Uterine Fundus: firm DVT Evaluation: No calf swelling or tenderness Extremities: no edema; +2 dorsalis pedis pulses   Recent Labs  06/08/16 0810 06/10/16 0626  HGB 10.8* 9.1*  HCT 32.9* 27.4*    Assessment/Plan:  ASSESSMENT: Evelyn [name] is a 36 y.o. G1P1001 2651w1d s/p PTLCs for failure to descent and persistent op position  Plan for discharge tomorrow, Lactation consult and Contraception IUD; breastfeeding & bottle currently   LOS: 2 days   Collie Siadgar Ogar, Medical Student MS3 [facility]  06/10/2016, 9:16 AM   I have seen and examined this patient and I agree with the above. See my PP note. Cam HaiSHAW, Evelyn [name] 4:25 PM 06/10/2016

## 2016-06-10 NOTE — Anesthesia Postprocedure Evaluation (Addendum)
Anesthesia Post Note  Patient: Evelyn [name]  Procedure(s) Performed: Procedure(s) (LRB): CESAREAN SECTION (N/A)  Patient location during evaluation: Mother Baby Anesthesia Type: Epidural Level of consciousness: awake, awake and alert, oriented and patient cooperative Pain management: pain level controlled Vital Signs Assessment: post-procedure vital signs reviewed and stable Respiratory status: spontaneous breathing, nonlabored ventilation and respiratory function stable Cardiovascular status: stable Postop Assessment: no headache, no backache, patient able to bend at knees and no signs of nausea or vomiting Anesthetic complications: no        Last Vitals:  Vitals:   06/10/16 0057 06/10/16 0509  BP:  (!) 88/52  Pulse:  70  Resp: 18 16  Temp: 36.6 C 36.5 C    Last Pain:  Vitals:   06/10/16 0509  TempSrc: Oral  PainSc: 0-No pain   Pain Goal: Patients Stated Pain Goal: 7 (06/08/16 0819)               CARVER,ALISON L

## 2016-06-11 MED ORDER — OXYCODONE HCL 5 MG PO TABS: 5.0000 mg | ORAL_TABLET | ORAL | 0 refills | Status: DC | PRN

## 2016-06-11 MED ORDER — IBUPROFEN 600 MG PO TABS: 600.0000 mg | ORAL_TABLET | Freq: Four times a day (QID) | ORAL | 0 refills | Status: DC | PRN

## 2016-06-11 NOTE — Discharge Instructions (Signed)

## 2016-06-11 NOTE — Discharge Summary (Signed)
OB Discharge Summary     Patient Name: Evelyn [name] DOB: 06-Oct-1980 MRN: [id number]  Date of admission: 06/08/2016 Delivering MD: Hermina StaggersERVIN, MICHAEL L   Date of discharge: 06/11/2016  Admitting diagnosis: INDUCTION Intrauterine pregnancy: 515w1d     Secondary diagnosis:  Active Problems:   Post term pregnancy  Additional problems: none     Discharge diagnosis: Term Pregnancy Delivered                                                                                                Post partum procedures:none  Augmentation: AROM, Pitocin, Cytotec and Foley Balloon  Complications: None  Hospital course:  Induction of Labor With Cesarean Section  36 y.o. yo G1P1001 at 4815w1d was admitted to the hospital 06/08/2016 for induction of labor due to postdates. Patient had a labor course significant for cx ripening accomplished with cytotec and foley bulb, followed by Pitocin and AROM. She became complete and pushed approx 2 hrs without progress and the decision was made to proceed with C/S. The patient went for cesarean section due to Arrest of Descent, and delivered a Viable infant,@BABYSUPPRESS (DBLINK,ept,110,,1,,) Membrane Rupture Time/Date: )6:15 AM ,06/09/2016   @Details  of operation can be found in separate operative Note.  Patient had an uncomplicated postpartum course. She is ambulating, tolerating a regular diet, passing flatus, and urinating well.  Patient is discharged home in stable condition on 06/15/16.                                    Physical exam  Vitals:   06/10/16 0930 06/10/16 1342 06/10/16 1725 06/11/16 0600  BP: (!) 90/44 (!) 96/57 (!) 100/48 (!) 107/53  Pulse: 67 74 76 75  Resp: 18 20 18 18   Temp: 98.2 F (36.8 C) 97.6 F (36.4 C) 97.7 F (36.5 C) 97.8 F (36.6 C)  TempSrc: Oral Oral Oral Oral  SpO2: 96%     Weight:      Height:       General: alert and cooperative Lochia: appropriate Uterine Fundus: firm Incision: Dressing is clean, dry, and intact DVT  Evaluation: No evidence of DVT seen on physical exam. Labs: Lab Results  Component Value Date   WBC 22.4 (H) 06/10/2016   HGB 9.1 (L) 06/10/2016   HCT 27.4 (L) 06/10/2016   MCV 77.4 (L) 06/10/2016   PLT 172 06/10/2016   No flowsheet data found.  Discharge instruction: per After Visit Summary and "Baby and Me Booklet".  After visit meds:  Allergies as of 06/11/2016   No Known Allergies     Medication List    STOP taking these medications   omeprazole 20 MG capsule Commonly known as:  PRILOSEC     TAKE these medications   ferrous sulfate 325 (65 FE) MG EC tablet Take 325 mg by mouth 3 (three) times daily with meals.   ibuprofen 600 MG tablet Commonly known as:  ADVIL,MOTRIN Take 1 tablet (600 mg total) by mouth every 6 (six) hours as needed.   oxyCODONE 5 MG  immediate release tablet Commonly known as:  Oxy IR/ROXICODONE Take 1 tablet (5 mg total) by mouth every 4 (four) hours as needed (pain scale 4-7).   PRENATAL VITAMIN PO Take by mouth.       Diet: routine diet  Activity: Advance as tolerated. Pelvic rest for 6 weeks.   Outpatient follow up:6 weeks Follow up Appt:No future appointments. Follow up Visit:No Follow-up on file.  Postpartum contraception: IUD Mirena  Newborn Data: Live born female  Birth Weight: 7 lb 15.9 oz (3625 g) APGAR: 5, 9  Baby Feeding: Bottle and Breast Disposition:home with mother   06/11/2016 Cam Hai, CNM

## 2016-06-21 ENCOUNTER — Ambulatory Visit: Payer: Medicaid Other | Admitting: Certified Nurse Midwife

## 2016-07-15 ENCOUNTER — Encounter: Payer: Self-pay | Admitting: Certified Nurse Midwife

## 2016-07-15 ENCOUNTER — Ambulatory Visit (INDEPENDENT_AMBULATORY_CARE_PROVIDER_SITE_OTHER): Payer: Medicaid Other | Admitting: Certified Nurse Midwife

## 2016-07-15 DIAGNOSIS — Z30011 Encounter for initial prescription of contraceptive pills: Secondary | ICD-10-CM

## 2016-07-15 MED ORDER — NORETHIN ACE-ETH ESTRAD-FE 1-20 MG-MCG PO TABS: 1.0000 | ORAL_TABLET | Freq: Every day | ORAL | 11 refills | Status: DC

## 2016-07-15 NOTE — Progress Notes (Signed)
Post Partum Exam  [name] is a 36 y.o. 331P1001 female who presents for a postpartum visit. She is 5 weeks postpartum following a low cervical transverse Cesarean section. I have fully reviewed the prenatal and intrapartum course. The delivery was at 41.2 gestational weeks.  Anesthesia: spinal. Postpartum course has been normal. Baby's course has been normal. Baby is feeding by bottle  Bleeding staining only. Bowel function is normal. Bladder function is normal. Patient is not sexually active. Contraception method is abstinence. Postpartum depression screening:neg  The following portions of the patient's history were reviewed and updated as appropriate: allergies, current medications, past family history, past medical history, past social history, past surgical history and problem list.  Review of Systems Pertinent items noted in HPI and remainder of comprehensive ROS otherwise negative.    Objective:  unknown if currently breastfeeding.  General:  alert, cooperative and no distress   Breasts:  inspection negative, no nipple discharge or bleeding, no masses or nodularity palpable  Lungs: clear to auscultation bilaterally  Heart:  regular rate and rhythm, S1, S2 normal, no murmur, click, rub or gallop  Abdomen: soft, non-tender; bowel sounds normal; no masses,  no organomegaly and C-section wound: well approximated, no s/s infection.    Vulva:  normal  Vagina: normal vagina, no discharge, exudate, lesion, or erythema  Cervix:  no cervical motion tenderness  Corpus: normal size, contour, position, consistency, mobility, non-tender  Adnexa:  no mass, fullness, tenderness  Rectal Exam: Not performed.        Assessment:    Normal 5 week postpartum exam. Pap smear not done at today's visit.  Postpartum examination following cesarean delivery - Plan: norethindrone-ethinyl estradiol (JUNEL FE 1/20) 1-20 MG-MCG tablet  Encounter for initial prescription of contraceptive pills - Plan:  norethindrone-ethinyl estradiol (JUNEL FE 1/20) 1-20 MG-MCG tablet   Plan:   1. Contraception: abstinence and OCP (estrogen/progesterone) 2. Follow up in: 1 year or as needed.

## 2016-07-25 ENCOUNTER — Other Ambulatory Visit (HOSPITAL_COMMUNITY)
Admission: RE | Admit: 2016-07-25 | Discharge: 2016-07-25 | Disposition: A | Discharge status: Home / Self Care | Payer: Medicaid Other | Source: Ambulatory Visit | Attending: Certified Nurse Midwife | Admitting: Certified Nurse Midwife

## 2016-07-25 ENCOUNTER — Ambulatory Visit (INDEPENDENT_AMBULATORY_CARE_PROVIDER_SITE_OTHER): Payer: Medicaid Other | Admitting: Certified Nurse Midwife

## 2016-07-25 ENCOUNTER — Encounter: Payer: Self-pay | Admitting: Certified Nurse Midwife

## 2016-07-25 VITALS — BP 117/81 | HR 91 | Temp 98.2°F | Wt 192.6 lb

## 2016-07-25 DIAGNOSIS — Z30430 Encounter for insertion of intrauterine contraceptive device: Secondary | ICD-10-CM | POA: Insufficient documentation

## 2016-07-25 LAB — POCT URINE PREGNANCY: Preg Test, Ur: NEGATIVE

## 2016-07-25 MED ORDER — LEVONORGESTREL 20 MCG/24HR IU IUD
INTRAUTERINE_SYSTEM | Freq: Once | INTRAUTERINE | Status: AC
  Administered 2016-07-25: 15:00:00 via INTRAUTERINE

## 2016-07-25 NOTE — Addendum Note (Signed)
Addended by: Natale MilchSTALLING, BRITTANY D on: 07/25/2016 05:06 PM   Modules accepted: Orders

## 2016-07-25 NOTE — Progress Notes (Signed)
IUD Procedure Note   DIAGNOSIS: Desires long-term, reversible contraception   PROCEDURE: IUD placement Performing Provider: Orvilla Cornwallachelle Stiles Maxcy CNM  Patient counseled prior to procedure. I explained risks and benefits of Mirena IUD, reviewed alternative forms of contraception. Patient stated understanding and consented to continue with procedure.   LMP: uknown Pregnancy Test: Negative Lot #: [id number] Expiration Date: 01/2019   IUD type: [X]  Mirena   [  ] Paragard  [  ] Candise CheLyletta   [  ]  Kyleena  PROCEDURE:  Timeout procedure was performed to ensure right patient and right site.  A bimanual exam was performed to determine the position of the uterus, anteverted. The speculum was placed. The vagina and cervix was sterilized in the usual manner and sterile technique was maintained throughout the course of the procedure. A single toothed tenaculum was applied to the posterior lip of the cervix and gentle traction applied. The depth of the uterus was sounded to 9 cm. With gentle traction on the tenaculum, the IUD was inserted to the appropriate depth and inserted without difficulty.  The string was cut to an estimated 4 cm length. Bleeding was minimal. The patient tolerated the procedure well.   Follow up: The patient tolerated the procedure well without complications.  Standard post-procedure care is explained and return precautions are given.  Orvilla Cornwallachelle Hue Frick CNM

## 2016-07-25 NOTE — Progress Notes (Signed)
Patient is in the office for IUD insertion. 

## 2016-07-26 LAB — CERVICOVAGINAL ANCILLARY ONLY
Bacterial vaginitis: NEGATIVE
Candida vaginitis: NEGATIVE
Chlamydia: NEGATIVE
NEISSERIA GONORRHEA: NEGATIVE
TRICH (WINDOWPATH): NEGATIVE

## 2016-07-26 LAB — HCG, SERUM, QUALITATIVE: hCG,Beta Subunit,Qual,Serum: NEGATIVE m[IU]/mL (ref <6)

## 2016-08-25 ENCOUNTER — Ambulatory Visit (INDEPENDENT_AMBULATORY_CARE_PROVIDER_SITE_OTHER): Payer: Medicaid Other | Admitting: Certified Nurse Midwife

## 2016-08-25 ENCOUNTER — Encounter: Payer: Self-pay | Admitting: Certified Nurse Midwife

## 2016-08-25 VITALS — BP 106/81 | HR 62 | Wt 198.0 lb

## 2016-08-25 DIAGNOSIS — Z30431 Encounter for routine checking of intrauterine contraceptive device: Secondary | ICD-10-CM

## 2016-08-25 NOTE — Progress Notes (Signed)
Patient presents for Mirena IUD String Check.

## 2016-08-25 NOTE — Progress Notes (Signed)
Subjective:    Evelyn [name] is a Hispanic 36 year old female who presents for contraception counseling. The patient has no complaints today. The patient is sexually active. Pertinent past medical history: none.  Likes her Mirena IUD.  Is not currently bleeding with the IUD.    The information documented in the HPI was reviewed and verified.  Menstrual History: OB History    Gravida Para Term Preterm AB Living   SAB TAB Ectopic Multiple Live Births         0 1      No LMP recorded. has Mirena IUD   Patient Active Problem List   Diagnosis Date Noted   Patient Active Problem List   Diagnosis Date Noted  . 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  Past Surgical History:  Procedure Laterality Date   Past Surgical History:  Procedure Laterality Date  . BREAST ENHANCEMENT SURGERY    . CESAREAN SECTION N/A 06/09/2016   Procedure: CESAREAN SECTION;  Surgeon: Hermina Staggers, MD;  Location: [facility]IRTHING SUITES;  Service: Obstetrics;  Laterality: N/A;  . LAPAROSCOPIC GASTRIC SLEEVE RESECTION        Current Outpatient Prescriptions:  No medication comments found.   Allergies  Allergen Reactions  No Known Allergies   Social History  Substance Use Topics   Social History   Social History  . Marital status: Single    Spouse name: N/A  . Number of children: N/A  . Years of education: N/A   Occupational History  . Not on file.   Social History Main Topics  . Smoking status: Former Smoker    Packs/day: 0.25    Years: 10.00    Types: Cigarettes  . Smokeless tobacco: Never Used  . Alcohol use No  . Drug use: No  . Sexual activity: Yes    Partners: Male    Birth control/ protection: IUD   Other Topics Concern  . Not on file   Social History Narrative  . No narrative on file     No family history on file.     Review of Systems Constitutional: negative for weight loss Genitourinary:negative for abnormal  menstrual periods and vaginal discharge   Objective:   BP 115/77   Pulse 89   Wt 161 lb (73 kg)   BMI 25.22 kg/m    General:   alert  Skin:   no rash or abnormalities  Lungs:   clear to auscultation bilaterally  Heart:   regular rate and rhythm, S1, S2 normal, no murmur, click, rub or gallop  Breasts:   deferred  Abdomen:  normal findings: no organomegaly, soft, non-tender and no hernia C-section wound C/D/I, no s/s infection  Pelvis:  External genitalia: normal general appearance Urinary system: urethral meatus normal and bladder without fullness, nontender Vaginal: normal without tenderness, induration or masses Cervix: no CMT, IUD strings present Adnexa: normal bimanual exam Uterus: anteverted and non-tender, normal size   Lab Review Urine pregnancy test Labs reviewed yes Radiologic studies reviewed no  50% of 15 min visit spent on counseling and coordination of care.    Assessment:    36 y.o., continuing IUD, no contraindications.   Plan:    All questions answered.  No orders of the defined types were placed in this encounter.  No orders of the defined types were placed in this encounter.   Follow up as needed or  in 1 year for annual exam.

## 2016-12-28 NOTE — Addendum Note (Signed)
Addendum  created 12/28/16 1141 by Daveena Elmore, MD   Sign clinical note    

## 2016-12-28 NOTE — Addendum Note (Signed)
Addendum  created 12/28/16 1138 by Zeric Baranowski, MD   Sign clinical note    

## 2017-01-17 ENCOUNTER — Ambulatory Visit: Payer: Medicaid Other | Admitting: Adult Health

## 2017-05-23 NOTE — Progress Notes (Deleted)
   Subjective:    Patient ID: Evelyn [name], female    DOB: 1980/12/18, 37 y.o.   MRN: [id number]  HPI:  Evelyn [name] is here to establish as a new pt.  She is a pleasant 37 year old female.  PMH:    Review of Systems     Objective:   Physical Exam        Assessment & Plan:

## 2017-05-25 ENCOUNTER — Ambulatory Visit: Payer: Self-pay | Admitting: Adult Health

## 2017-07-13 ENCOUNTER — Ambulatory Visit: Payer: Medicaid Other | Admitting: Obstetrics and Gynecology

## 2017-08-22 ENCOUNTER — Other Ambulatory Visit (HOSPITAL_COMMUNITY): Payer: Self-pay | Admitting: Obstetrics and Gynecology

## 2017-08-22 ENCOUNTER — Ambulatory Visit (HOSPITAL_COMMUNITY)
Admission: RE | Admit: 2017-08-22 | Discharge: 2017-08-22 | Disposition: A | Discharge status: Home / Self Care | Payer: 59 | Source: Ambulatory Visit | Attending: Obstetrics and Gynecology | Admitting: Obstetrics and Gynecology

## 2017-08-22 DIAGNOSIS — Z975 Presence of (intrauterine) contraceptive device: Secondary | ICD-10-CM | POA: Diagnosis present

## 2018-03-13 DIAGNOSIS — O09521 Supervision of elderly multigravida, first trimester: Secondary | ICD-10-CM | POA: Insufficient documentation

## 2018-03-13 DIAGNOSIS — Z98891 History of uterine scar from previous surgery: Secondary | ICD-10-CM | POA: Insufficient documentation

## 2018-10-07 DIAGNOSIS — Z302 Encounter for sterilization: Secondary | ICD-10-CM | POA: Insufficient documentation

## 2021-04-20 ENCOUNTER — Other Ambulatory Visit: Payer: Self-pay | Admitting: Obstetrics and Gynecology

## 2021-04-20 DIAGNOSIS — R928 Other abnormal and inconclusive findings on diagnostic imaging of breast: Secondary | ICD-10-CM

## 2021-05-11 ENCOUNTER — Other Ambulatory Visit: Payer: 59

## 2021-05-11 ENCOUNTER — Ambulatory Visit
Admission: RE | Admit: 2021-05-11 | Discharge: 2021-05-11 | Disposition: A | Discharge status: Home / Self Care | Payer: Medicaid Other | Source: Ambulatory Visit | Attending: Obstetrics and Gynecology | Admitting: Obstetrics and Gynecology

## 2021-05-11 ENCOUNTER — Other Ambulatory Visit: Payer: Self-pay | Admitting: Obstetrics and Gynecology

## 2021-05-11 DIAGNOSIS — R928 Other abnormal and inconclusive findings on diagnostic imaging of breast: Secondary | ICD-10-CM

## 2021-06-01 ENCOUNTER — Ambulatory Visit
Admission: RE | Admit: 2021-06-01 | Discharge: 2021-06-01 | Disposition: A | Discharge status: Home / Self Care | Payer: Medicaid Other | Source: Ambulatory Visit | Attending: Obstetrics and Gynecology | Admitting: Obstetrics and Gynecology

## 2021-06-01 ENCOUNTER — Other Ambulatory Visit: Payer: Self-pay | Admitting: Obstetrics and Gynecology

## 2021-06-01 DIAGNOSIS — R928 Other abnormal and inconclusive findings on diagnostic imaging of breast: Secondary | ICD-10-CM

## 2021-07-17 NOTE — Progress Notes (Unsigned)
°  Subjective:    Evelyn [name] - 41 y.o. female MRN [id number]  Date of birth: [birth year]  HPI  Normal Kuzmich is to establish care.   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 Her smoking use included cigarettes. She has a 2.50 pack-year smoking history. She has never used smokeless tobacco. She reports that she does not drink alcohol and does not use drugs.   Family History  family history includes Cancer in her father and maternal grandmother.   Medications: reviewed and updated   Objective:   Physical Exam There were no vitals taken for this visit. Physical Exam      Assessment & Plan:         Patient was given clear instructions to go to Emergency Department or return to medical center if symptoms don't improve, worsen, or new problems develop.The patient verbalized understanding.  I discussed the assessment and treatment plan with the patient. The patient was provided an opportunity to ask questions and all were answered. The patient agreed with the plan and demonstrated an understanding of the instructions.   The patient was advised to call back or seek an in-person evaluation if the symptoms worsen or if the condition fails to improve as anticipated.    Durene Fruits, NP 07/17/2021, 5:35 PM Primary Care at [facility]

## 2021-07-20 ENCOUNTER — Ambulatory Visit (INDEPENDENT_AMBULATORY_CARE_PROVIDER_SITE_OTHER): Payer: Medicaid Other | Admitting: Family

## 2021-07-20 ENCOUNTER — Other Ambulatory Visit: Payer: Self-pay

## 2021-07-20 ENCOUNTER — Encounter: Payer: Self-pay | Admitting: Family

## 2021-07-20 ENCOUNTER — Other Ambulatory Visit (HOSPITAL_COMMUNITY)
Admission: RE | Admit: 2021-07-20 | Discharge: 2021-07-20 | Disposition: A | Discharge status: Home / Self Care | Payer: Medicaid Other | Source: Ambulatory Visit | Attending: Family | Admitting: Family

## 2021-07-20 VITALS — BP 138/91 | HR 67 | Temp 98.3°F | Resp 18 | Ht 67.52 in | Wt 170.0 lb

## 2021-07-20 DIAGNOSIS — Z7689 Persons encountering health services in other specified circumstances: Secondary | ICD-10-CM

## 2021-07-20 DIAGNOSIS — L819 Disorder of pigmentation, unspecified: Secondary | ICD-10-CM | POA: Diagnosis not present

## 2021-07-20 DIAGNOSIS — R399 Unspecified symptoms and signs involving the genitourinary system: Secondary | ICD-10-CM

## 2021-07-20 DIAGNOSIS — F17210 Nicotine dependence, cigarettes, uncomplicated: Secondary | ICD-10-CM | POA: Diagnosis not present

## 2021-07-20 DIAGNOSIS — F411 Generalized anxiety disorder: Secondary | ICD-10-CM

## 2021-07-20 DIAGNOSIS — F17200 Nicotine dependence, unspecified, uncomplicated: Secondary | ICD-10-CM

## 2021-07-20 DIAGNOSIS — R030 Elevated blood-pressure reading, without diagnosis of hypertension: Secondary | ICD-10-CM

## 2021-07-20 DIAGNOSIS — R140 Abdominal distension (gaseous): Secondary | ICD-10-CM

## 2021-07-20 LAB — POCT URINALYSIS DIP (CLINITEK)
Bilirubin, UA: NEGATIVE
Blood, UA: NEGATIVE
Glucose, UA: NEGATIVE mg/dL
Ketones, POC UA: NEGATIVE mg/dL
Leukocytes, UA: NEGATIVE
Nitrite, UA: NEGATIVE
POC PROTEIN,UA: NEGATIVE
Spec Grav, UA: >=1.03 — AB (ref 1.010–1.025)
Urobilinogen, UA: 0.2 E.U./dL
pH, UA: 6 (ref 5.0–8.0)

## 2021-07-20 MED ORDER — HYDROXYZINE PAMOATE 25 MG PO CAPS: 25.0000 mg | ORAL_CAPSULE | Freq: Three times a day (TID) | ORAL | 1 refills | Status: DC | PRN

## 2021-07-20 NOTE — Progress Notes (Signed)
Pt presents to establish care,  ?Pt complains of UTI symptoms including foul odor, lower back pain and frequent urination  ?Pt complains of finger discoloration that started about month ago symptoms include numbness denies any pain. ?

## 2021-07-20 NOTE — Progress Notes (Signed)
No urinary tract infection.

## 2021-07-20 NOTE — Patient Instructions (Signed)
Thank you for choosing Primary Care at [facility] for your medical home!   ? ?Laurine Blazer was seen by Rema Fendt, NP today.  ? ?Lynford Citizen primary care provider is Ricky Stabs, NP.   ?For the best care possible,  you should try to see Ricky Stabs, NP ?whenever you come to clinic.  ? ?We look forward to seeing you again soon! ? ?If you have any questions about your visit today,  ?please call us at [phone] ? ?Or feel free to reach your provider via MyChart.   ? ?Keeping you healthy ?  ?Get these tests ?Blood pressure- Have your blood pressure checked once a year by your healthcare provider.  Normal blood pressure is 120/80. ?Weight- Have your body mass index (BMI) calculated to screen for obesity.  BMI is a measure of body fat based on height and weight. You can also calculate your own BMI at https://www.west-esparza.com/. ?Cholesterol- Have your cholesterol checked regularly starting at age 35, sooner may be necessary if you have diabetes, high blood pressure, if a family member developed heart diseases at an early age or if you smoke.  ?Chlamydia, HIV, and other sexual transmitted disease- Get screened each year until the age of 4 then within three months of each new sexual partner. ?Diabetes- Have your blood sugar checked regularly if you have high blood pressure, high cholesterol, a family history of diabetes or if you are overweight. ?  ?Get these vaccines ?Flu shot- Every fall. ?Tetanus shot- Every 10 years. ?Menactra- Single dose; prevents meningitis. ?  ?Take these steps ?Don't smoke- If you do smoke, ask your healthcare provider about quitting. For tips on how to quit, go to www.smokefree.gov or call 1-800-QUIT-NOW. ?Be physically active- Exercise 5 days a week for at least 30 minutes.  If you are not already physically active start slow and gradually work up to 30 minutes of moderate physical activity.  Examples of moderate activity include walking briskly, mowing the  yard, dancing, swimming bicycling, etc. ?Eat a healthy diet- Eat a variety of healthy foods such as fruits, vegetables, low fat milk, low fat cheese, yogurt, lean meats, poultry, fish, beans, tofu, etc.  For more information on healthy eating, go to www.thenutritionsource.org ?Drink alcohol in moderation- Limit alcohol intake two drinks or less a day.  Never drink and drive. ?Dentist- Brush and floss teeth twice daily; visit your dentis twice a year. ?Depression-Your emotional health is as important as your physical health.  If you're feeling down, losing interest in things you normally enjoy please talk with your healthcare provider. ?Arboriculturist- If you keep a gun in your home, keep it unloaded and with the safety lock on.  Bullets should be stored separately. ?Helmet use- Always wear a helmet when riding a motorcycle, bicycle, rollerblading or skateboarding. ?Safe sex- If you may be exposed to a sexually transmitted infection, use a condom ?Seat belts- Seat bels can save your life; always wear one. ?Smoke/Carbon Monoxide detectors- These detectors need to be installed on the appropriate level of your home.  Replace batteries at least once a year. ?Skin Cancer- When out in the sun, cover up and use sunscreen SPF 15 or higher. ?Violence- If anyone is threatening or hurting you, please tell your healthcare provider. ? ?

## 2021-07-21 LAB — CERVICOVAGINAL ANCILLARY ONLY
Bacterial Vaginitis (gardnerella): NEGATIVE
Candida Glabrata: NEGATIVE
Candida Vaginitis: NEGATIVE
Chlamydia: NEGATIVE
Comment: NEGATIVE
Comment: NEGATIVE
Comment: NEGATIVE
Comment: NEGATIVE
Comment: NEGATIVE
Comment: NORMAL
Neisseria Gonorrhea: NEGATIVE
Trichomonas: NEGATIVE

## 2021-07-21 NOTE — Progress Notes (Signed)
Gonorrhea, Chlamydia, Trichomonas, Bacterial Vaginitis, and Candida Vaginitis (sometimes called a yeast infection) negative.

## 2021-08-25 ENCOUNTER — Encounter: Payer: Medicaid Other | Admitting: Family

## 2021-11-10 ENCOUNTER — Other Ambulatory Visit: Payer: Self-pay | Admitting: Family

## 2021-11-10 DIAGNOSIS — F411 Generalized anxiety disorder: Secondary | ICD-10-CM

## 2021-11-10 NOTE — Telephone Encounter (Signed)
Refilled per patient request. 

## 2022-02-22 ENCOUNTER — Other Ambulatory Visit: Payer: Self-pay | Admitting: Family

## 2022-02-22 DIAGNOSIS — F411 Generalized anxiety disorder: Secondary | ICD-10-CM

## 2022-02-28 NOTE — Progress Notes (Signed)
Patient ID: Evelyn [name], female    DOB: October 23, 1980  MRN: [id number]  CC: Medication Refills  Subjective: Evelyn [name] is a 41 y.o. female who presents for medication refills.  Her concerns today include:  Doing well on Hydroxyzine. Reports usually takes a few hours prior to bedtime (as needed) due to making her feel sleepy. Would like to know if there is an anxiety medication she can take during the daytime that does not have side effect of making sleepy. Denies thoughts of self-harm, suicidal ideations, and homicidal ideations.   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   No current facility-administered medications on file prior to visit.    No Known Allergies  Social History   Socioeconomic History   Marital status: Single    Spouse name: Not on file   Number of children: Not on file   Years of education: Not on file   Highest education level: Not on file  Occupational History   Not on file  Tobacco Use   Smoking status: Former    Packs/day: 0.25    Years: 10.00    Total pack years: 2.50    Types: Cigarettes    Passive exposure: Past   Smokeless tobacco: Never  Vaping Use   Vaping Use: Never used  Substance and Sexual Activity   Alcohol use: No   Drug use: No   Sexual activity: Yes    Partners: Male    Birth control/protection: Surgical  Other Topics Concern   Not on file  Social History  Narrative   Not on file   Social Determinants of Health   Financial Resource Strain: Not on file  Food Insecurity: Not on file  Transportation Needs: Not on file  Physical Activity: Not on file  Stress: Not on file  Social Connections: Not on file  Intimate Partner Violence: Not on file    Family History  Problem Relation Age of Onset   Cancer Father    Cancer Maternal Grandmother     Past Surgical History:  Procedure Laterality Date   BREAST ENHANCEMENT SURGERY     CESAREAN SECTION N/A 06/09/2016   Procedure: CESAREAN SECTION;  Surgeon: Hermina Staggers, MD;  Location: [facility];  Service: Obstetrics;  Laterality: N/A;   LAPAROSCOPIC GASTRIC SLEEVE RESECTION      ROS: Review of Systems Negative except as stated above  PHYSICAL EXAM: BP 134/88 (BP Location: Left Arm, Patient Position: Sitting, Cuff Size: Large)   Pulse 76   Temp 98.3 F (36.8 C)   Resp 16   Ht 5' 7.52" (1.715 m)   Wt 160 lb (72.6 kg)   SpO2 98%   BMI 24.68 kg/m   Physical Exam HENT:     Head: Normocephalic and atraumatic.  Eyes:     Extraocular Movements: Extraocular movements intact.     Conjunctiva/sclera: Conjunctivae normal.     Pupils: Pupils are equal, round, and reactive to  light.  Cardiovascular:     Rate and Rhythm: Normal rate and regular rhythm.     Pulses: Normal pulses.     Heart sounds: Normal heart sounds.  Pulmonary:     Effort: Pulmonary effort is normal.     Breath sounds: Normal breath sounds.  Musculoskeletal:     Cervical back: Normal range of motion and neck supple.  Neurological:     General: No focal deficit present.     Mental Status: She is alert and oriented to person, place, and time.  Psychiatric:        Mood and Affect: Mood normal.        Behavior: Behavior normal.     ASSESSMENT AND PLAN: 1. Generalized anxiety disorder - Patient denies thoughts of self-harm, suicidal ideations, homicidal ideations. - Continue Hydroxyzine as prescribed.  -  Discussed with patient she can take half tablet Hydroxyzine during the day should she need it to see if this helps with anxiety and to see if she has less side effects of sleepiness. She should trial this in a safe setting initially. Patient agreeable.  - Patient is aware to follow-up with me as needed.  - hydrOXYzine (ATARAX) 25 MG tablet; Take 1 tablet (25 mg total) by mouth every 8 (eight) hours as needed.  Dispense: 30 tablet; Refill: 2  2. Blood pressure check - Blood pressure stable.   Patient was given the opportunity to ask questions.  Patient verbalized understanding of the plan and was able to repeat key elements of the plan. Patient was given clear instructions to go to Emergency Department or return to medical center if symptoms don't improve, worsen, or new problems develop.The patient verbalized understanding.   Requested Prescriptions   Signed Prescriptions Disp Refills   hydrOXYzine (ATARAX) 25 MG tablet 30 tablet 2    Sig: Take 1 tablet (25 mg total) by mouth every 8 (eight) hours as needed.    Return in about 3 months (around 06/08/2022) for Follow-Up or next available anxiety .  Camillia Herter, NP

## 2022-03-08 ENCOUNTER — Ambulatory Visit (INDEPENDENT_AMBULATORY_CARE_PROVIDER_SITE_OTHER): Payer: Medicaid Other | Admitting: Family

## 2022-03-08 VITALS — BP 134/88 | HR 76 | Temp 98.3°F | Resp 16 | Ht 67.52 in | Wt 160.0 lb

## 2022-03-08 DIAGNOSIS — Z0130 Encounter for examination of blood pressure without abnormal findings: Secondary | ICD-10-CM

## 2022-03-08 DIAGNOSIS — R87619 Unspecified abnormal cytological findings in specimens from cervix uteri: Secondary | ICD-10-CM | POA: Insufficient documentation

## 2022-03-08 DIAGNOSIS — F411 Generalized anxiety disorder: Secondary | ICD-10-CM

## 2022-03-08 DIAGNOSIS — Z411 Encounter for cosmetic surgery: Secondary | ICD-10-CM | POA: Insufficient documentation

## 2022-03-08 MED ORDER — HYDROXYZINE HCL 25 MG PO TABS: 25.0000 mg | ORAL_TABLET | Freq: Three times a day (TID) | ORAL | 2 refills | Status: DC | PRN

## 2022-03-08 NOTE — Progress Notes (Signed)
Pt presents for medication refill.  

## 2022-12-14 ENCOUNTER — Other Ambulatory Visit: Payer: Self-pay

## 2022-12-14 ENCOUNTER — Other Ambulatory Visit: Payer: Self-pay | Admitting: Family

## 2022-12-14 DIAGNOSIS — F411 Generalized anxiety disorder: Secondary | ICD-10-CM

## 2022-12-14 MED ORDER — HYDROXYZINE HCL 25 MG PO TABS: 25.0000 mg | ORAL_TABLET | Freq: Three times a day (TID) | ORAL | 2 refills | Status: AC | PRN

## 2022-12-14 NOTE — Telephone Encounter (Signed)
Refill sent in @ 10:16am on 12/14/2022

## 2023-09-20 ENCOUNTER — Telehealth: Payer: Self-pay

## 2023-09-20 NOTE — Telephone Encounter (Signed)
Patient has appointment already

## 2023-09-20 NOTE — Telephone Encounter (Signed)
 Copied from CRM [id number]. Topic: Appointments - Transfer of Care >> Sep 20, 2023  1:13 PM [name] wrote: Pt is requesting to transfer FROM: Evelyn [name] Pt is requesting to transfer TO: Abraham Abo Reason for requested transfer: She states she just wanted another opinion from a different provider regarding her overall health It is the responsibility of the team the patient would like to transfer to (Dr. Elvan Hamel) to reach out to the patient if for any reason this transfer is not acceptable.

## 2023-10-30 IMAGING — MG MM BREAST LOCALIZATION CLIP
4 series · 4 of 12 positions shown · non-contrast
Comparison: Previous exam(s).

CLINICAL DATA: Status post ultrasound-guided biopsy left breast
mass.

EXAM:
DIAGNOSTIC LEFT MAMMOGRAM POST ULTRASOUND BIOPSY

[L CC synth-2D]
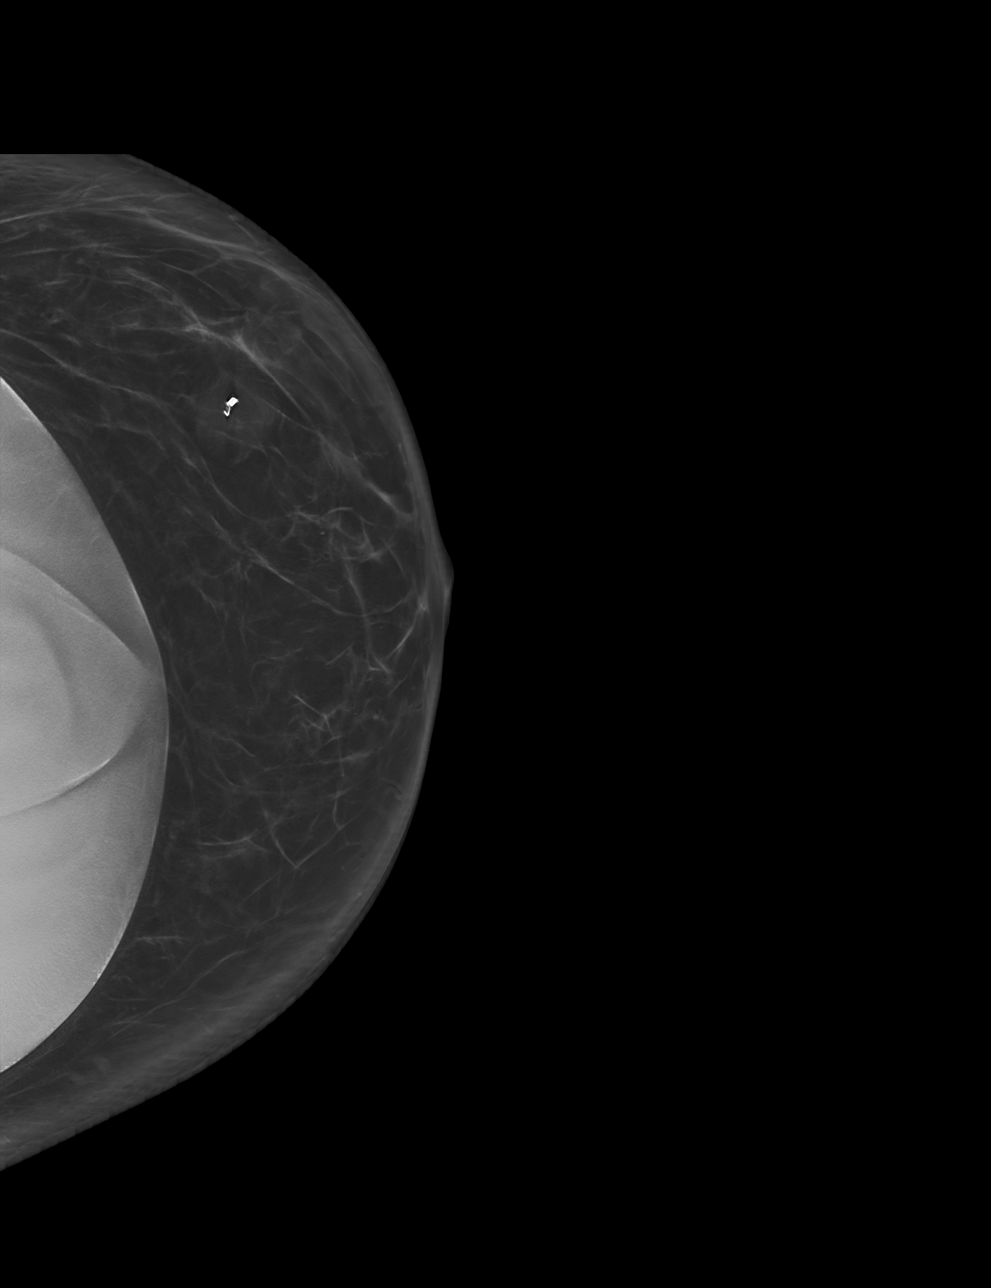

[L ML synth-2D]
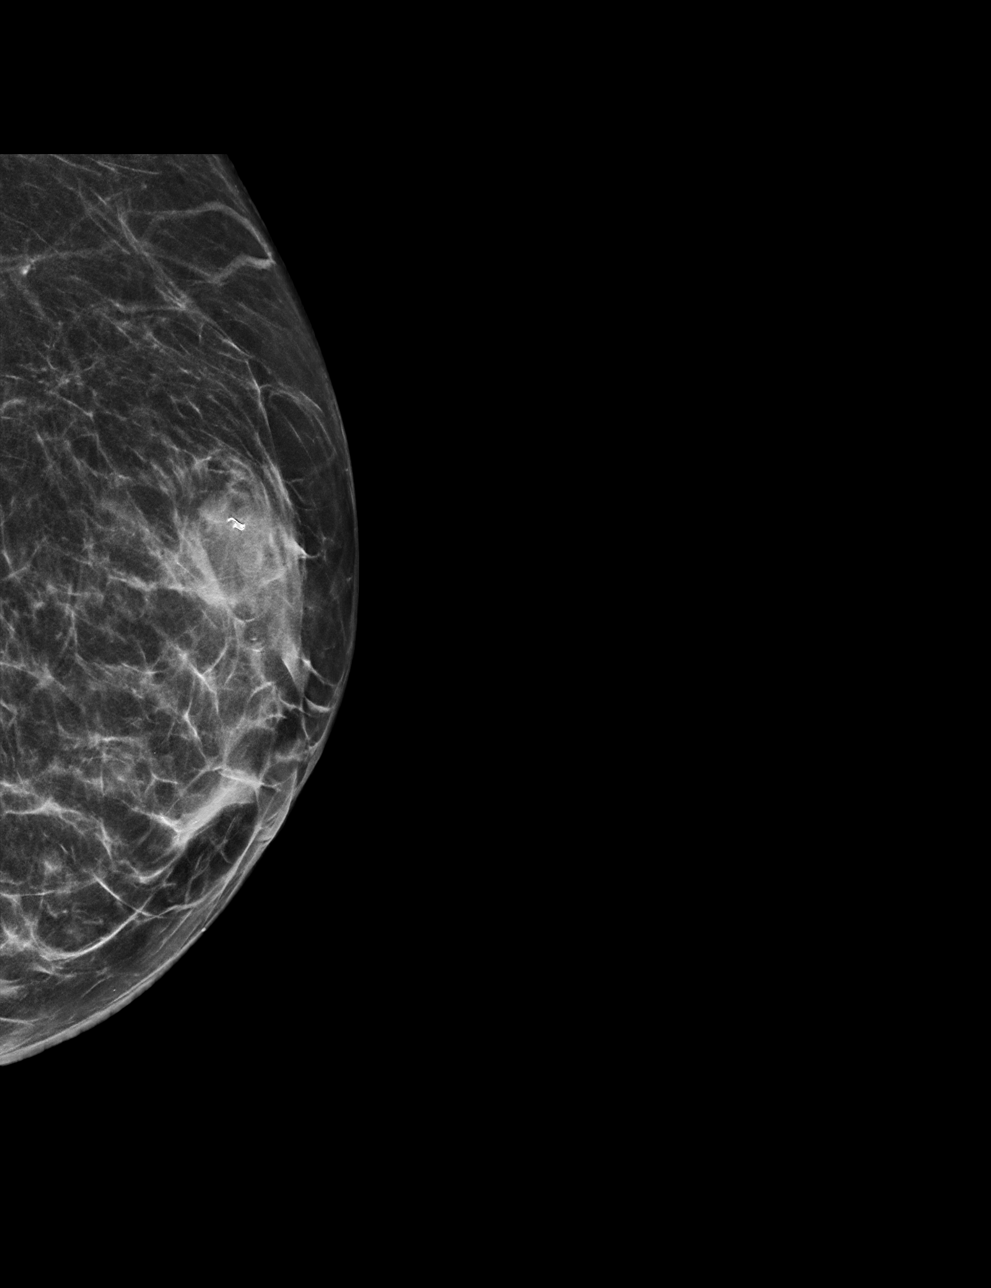

[L ML tomo · tomo slice 31/60.0]
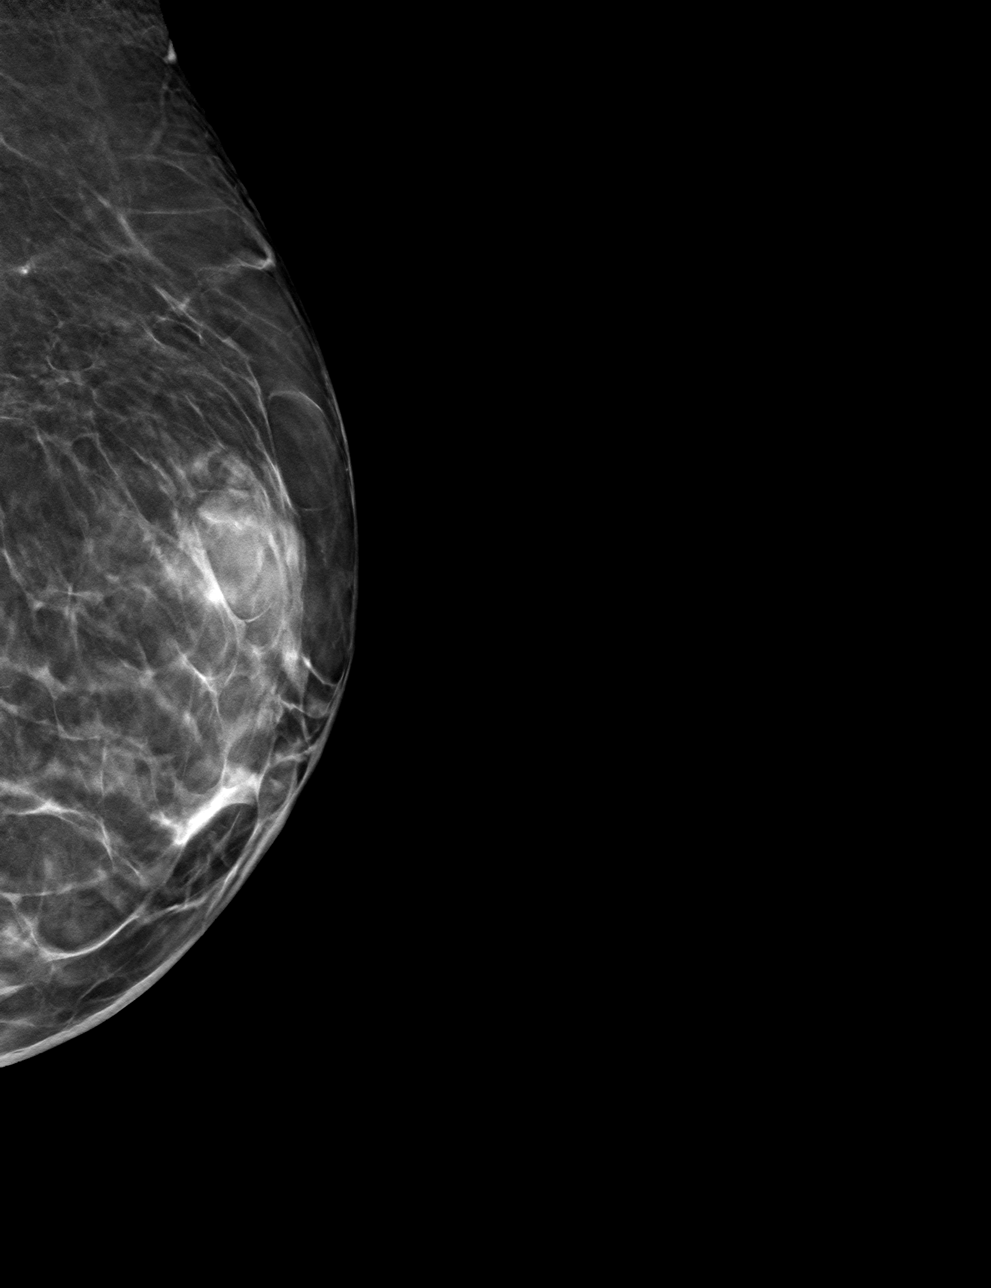

[L CC tomo · tomo slice 35/70.0]
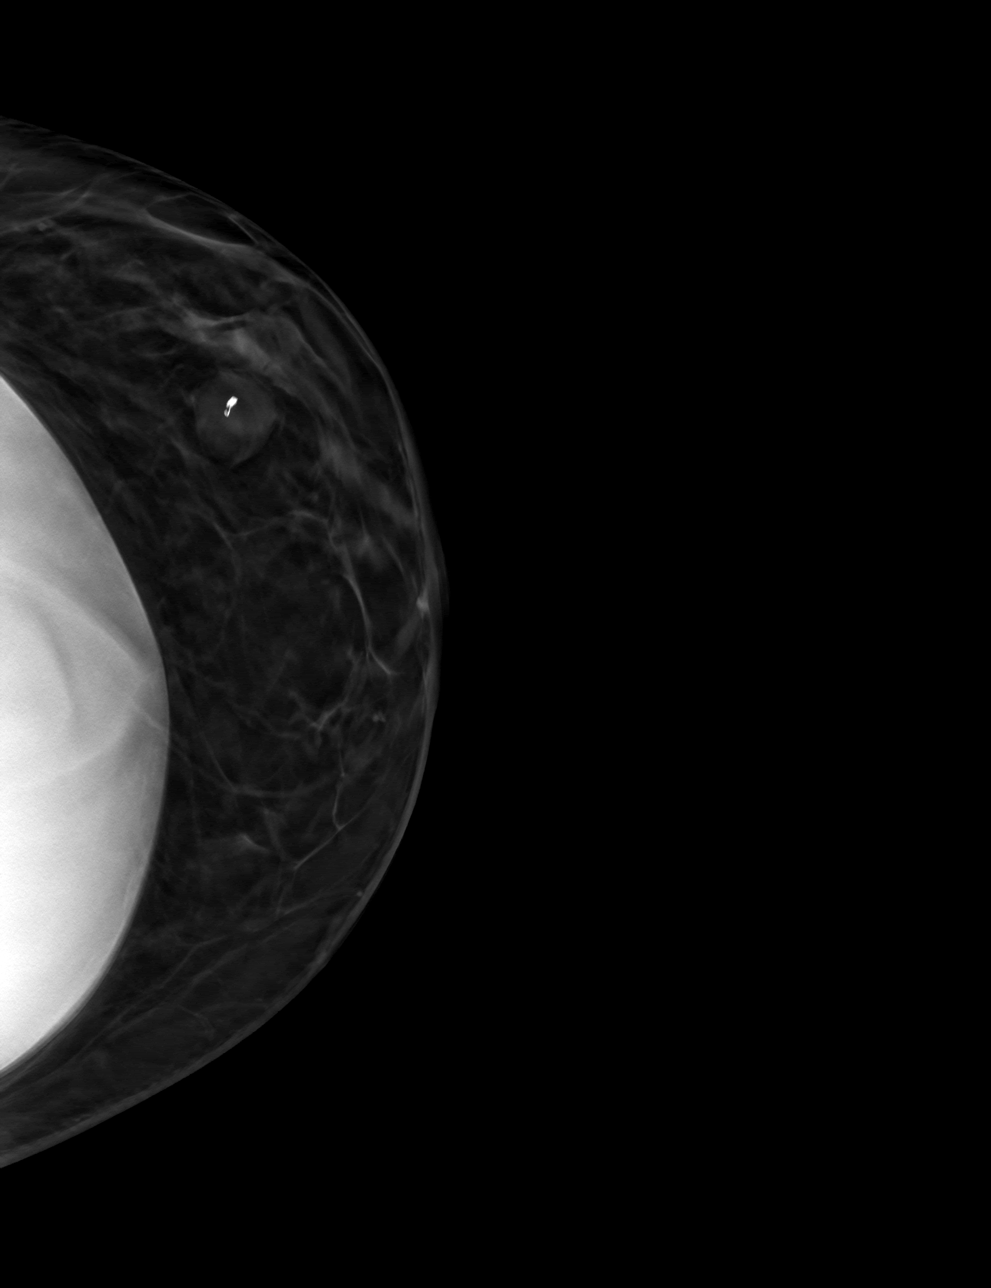

[4 of 12 positions shown; findings below may reference images not displayed]

FINDINGS: Mammographic images were obtained following ultrasound guided biopsy
of left breast mass. The biopsy marking clip is in expected position
at the site of biopsy.
IMPRESSION: Appropriate positioning of the coil shaped biopsy marking clip at
the site of biopsy in the upper-outer left breast.

Final Assessment: Post Procedure Mammograms for Marker Placement

## 2023-10-30 IMAGING — US US BREAST BX W LOC DEV 1ST LESION IMG BX SPEC US GUIDE*L*
1 series · 9 of 9 positions shown · non-contrast
Comparison: Previous exam(s).
COMPARISON: Previous exam(s).

Addendum:
CLINICAL DATA: Patient with indeterminate left breast mass.

EXAM:
ULTRASOUND GUIDED LEFT BREAST CORE NEEDLE BIOPSY

[Series 1: us breast bx w loc dev 1st lesion img bx spec us g · 0.06mm/px · 9 of 9 slices shown]
[im 1/9]
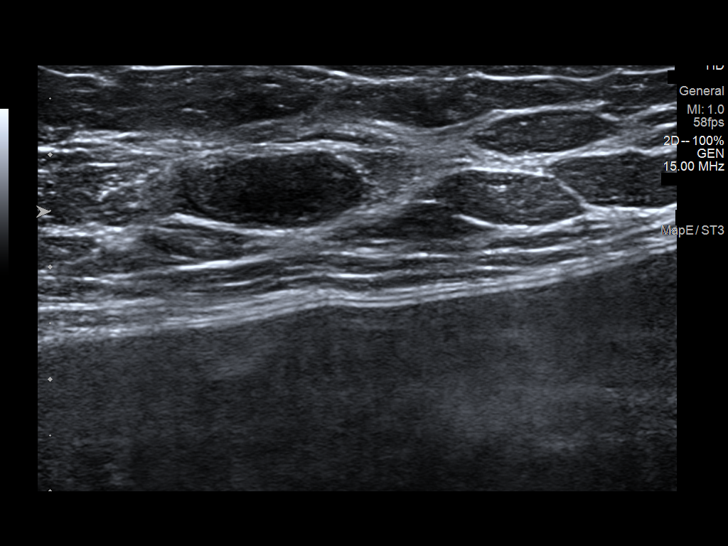
[im 2/9]
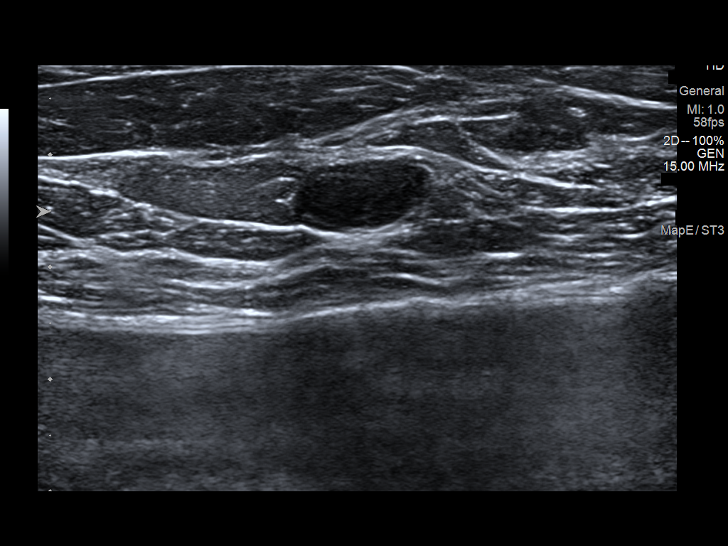
[im 3/9]
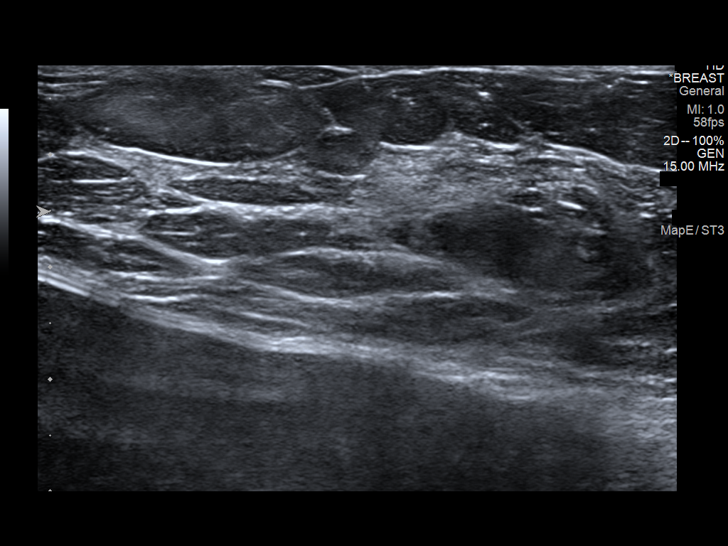
[im 4/9]
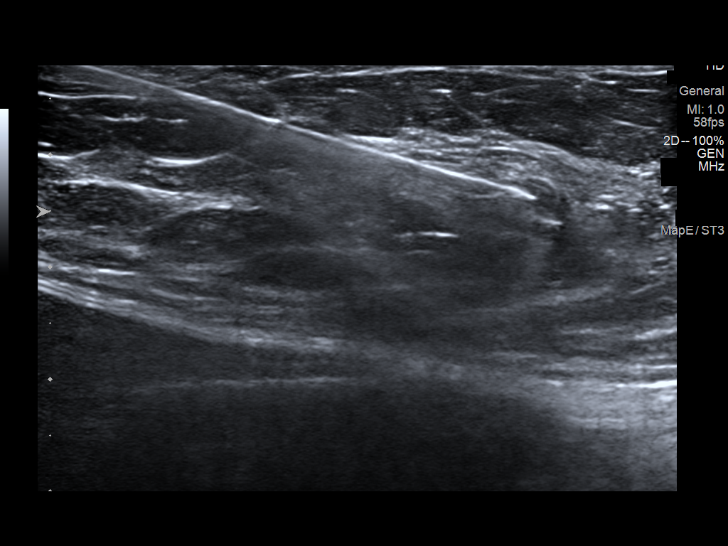
[im 5/9]
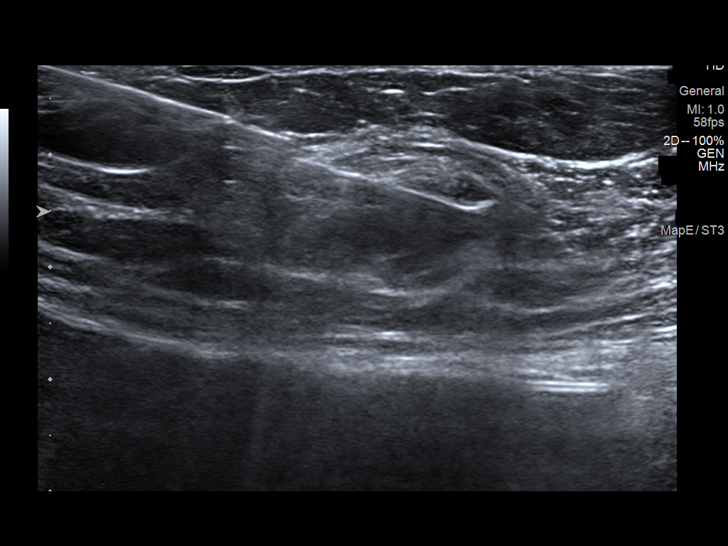
[im 6/9]
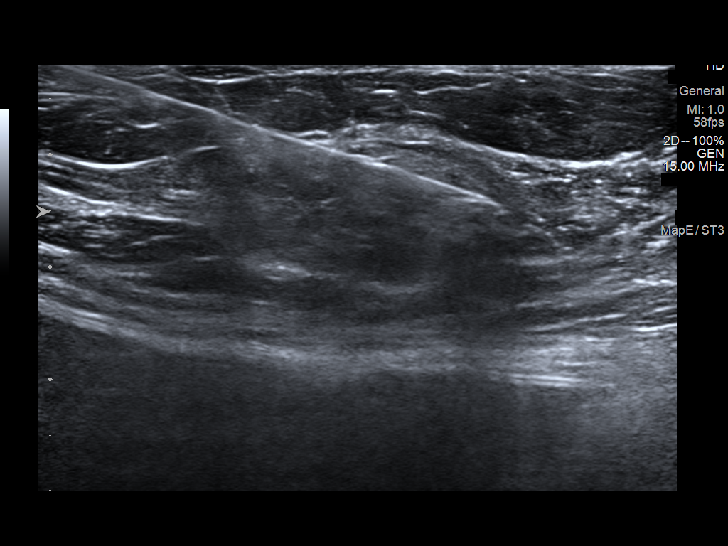
[im 7/9]
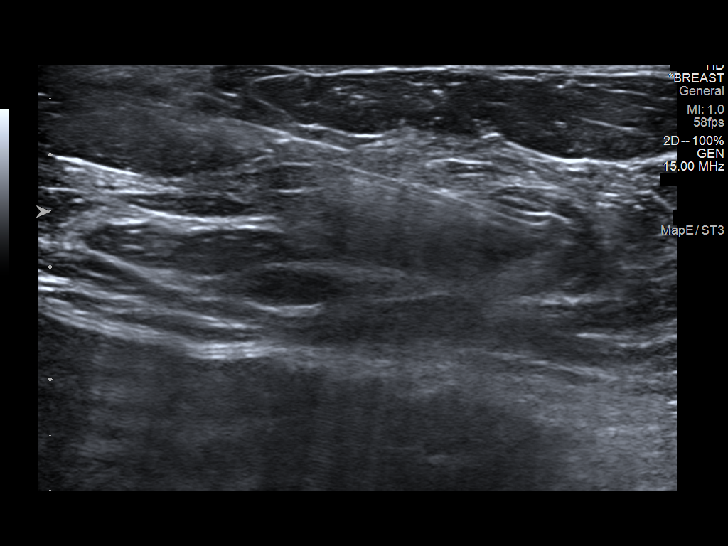
[im 8/9]
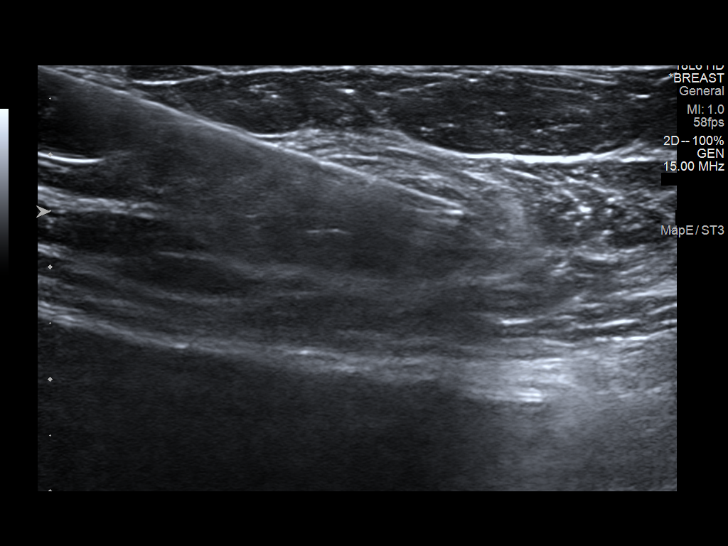
[im 9/9]
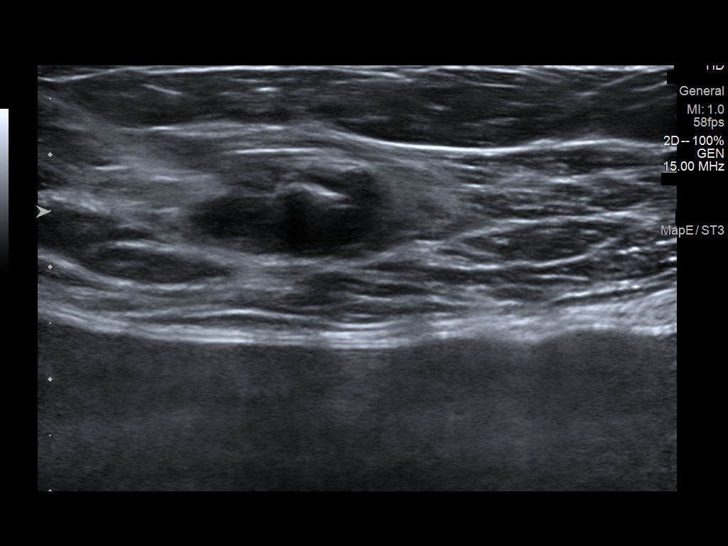

[9 of 9 positions shown; findings below may reference images not displayed]



Lesion quadrant: Upper outer quadrant

Using sterile technique and 1% Lidocaine as local anesthetic, under
direct ultrasound visualization, a 14 gauge Gines device was
used to perform biopsy of left breast mass 1:30 o'clock using a
approach. At the conclusion of the procedure coil shaped tissue
marker clip was deployed into the biopsy cavity. Follow up 2 view
mammogram was performed and dictated separately.
IMPRESSION: Ultrasound guided biopsy of left breast mass 1:30 o'clock. No
apparent complications.

ADDENDUM:
Pathology revealed FIBROEPITHELIAL LESION MOST CONSISTENT WITH
FIBROADENOMA- NO MALIGNANCY IDENTIFIED of the LEFT breast, 1:30
o'clock (coil clip). This was found to be concordant by Dr. Kruse
Jena.

Pathology results were discussed with the patient by telephone. The
patient reported doing well after the biopsy with tenderness at the
site. Post biopsy instructions and care were reviewed and questions
were answered. The patient was encouraged to call The [REDACTED]

The patient was instructed to return for annual screening
mammography due April 2022 and informed a reminder notice would
be sent regarding this appointment.

Pathology results reported by Jaylon Aujla RN on 06/03/2021.



Lesion quadrant: Upper outer quadrant

Using sterile technique and 1% Lidocaine as local anesthetic, under
direct ultrasound visualization, a 14 gauge Gines device was
used to perform biopsy of left breast mass 1:30 o'clock using a
approach. At the conclusion of the procedure coil shaped tissue
marker clip was deployed into the biopsy cavity. Follow up 2 view
mammogram was performed and dictated separately.
IMPRESSION: Ultrasound guided biopsy of left breast mass 1:30 o'clock. No
apparent complications.

## 2023-11-02 ENCOUNTER — Encounter: Admitting: Family Medicine

## 2023-11-17 ENCOUNTER — Other Ambulatory Visit: Payer: Self-pay | Admitting: Family

## 2023-11-17 DIAGNOSIS — F411 Generalized anxiety disorder: Secondary | ICD-10-CM

## 2023-11-17 NOTE — Telephone Encounter (Signed)
 Schedule appointment. Last office visit 03/08/2022.

## 2023-11-20 NOTE — Telephone Encounter (Signed)
 Pt scheduled for OV on 08/19

## 2023-11-23 NOTE — Telephone Encounter (Signed)
 I have attempted without success to contact this patient by phone to return their call and I left a message on answering machine.

## 2023-11-24 ENCOUNTER — Telehealth: Payer: Self-pay

## 2023-11-24 NOTE — Telephone Encounter (Signed)
 Copied from CRM [id number]. Topic: General - Other >> Nov 23, 2023  4:11 PM Delon DASEN wrote: Reason for CRM: Patient returing Aubrey's call- [phone]

## 2023-11-28 ENCOUNTER — Ambulatory Visit (INDEPENDENT_AMBULATORY_CARE_PROVIDER_SITE_OTHER): Admitting: Family

## 2023-11-28 ENCOUNTER — Encounter: Payer: Self-pay | Admitting: Family

## 2023-11-28 VITALS — BP 131/90 | HR 76 | Temp 98.7°F | Resp 16 | Ht 67.0 in | Wt 183.2 lb

## 2023-11-28 DIAGNOSIS — F411 Generalized anxiety disorder: Secondary | ICD-10-CM

## 2023-11-28 MED ORDER — HYDROXYZINE HCL 25 MG PO TABS: 25.0000 mg | ORAL_TABLET | Freq: Three times a day (TID) | ORAL | 2 refills | Status: DC | PRN

## 2023-11-28 NOTE — Progress Notes (Signed)
 Patient ID: Evelyn [name], female    DOB: [birth year]  MRN: [id number]  CC: Anxiety Follow-Up  Subjective: Evelyn [name] is a 43 y.o. female who presents for anxiety follow-up.   Her concerns today include:  Doing well on Hydroxyzine , no issues/concerns. She denies thoughts of self-harm, suicidal ideations, homicidal ideations.  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 (Patient not taking: Reported on 11/28/2023) 30 tablet 2   No current facility-administered medications on file prior to visit.    No Known Allergies  Social History   Socioeconomic History   Marital status: Single    Spouse name: Not on file   Number of children: Not on file   Years of education: Not on file   Highest education level: Not on file  Occupational History   Not on file  Tobacco Use   Smoking status: Former    Current packs/day: 0.25    Average packs/day: 0.3 packs/day for 10.0 years (2.5 ttl pk-yrs)    Types: Cigarettes    Passive exposure: Past   Smokeless tobacco: Never  Vaping Use   Vaping status: Never Used  Substance and Sexual Activity   Alcohol use: No   Drug use: No   Sexual activity: Yes    Partners: Male    Birth control/protection: Surgical  Other Topics Concern   Not on file   Social History Narrative   Not on file   Social Drivers of Health   Financial Resource Strain: Low Risk  (11/28/2023)   Overall Financial Resource Strain (CARDIA)    Difficulty of Paying Living Expenses: Not hard at all  Food Insecurity: No Food Insecurity (11/28/2023)   Hunger Vital Sign    Worried About Running Out of Food in the Last Year: Never true    Ran Out of Food in the Last Year: Never true  Transportation Needs: No Transportation Needs (11/28/2023)   PRAPARE - Administrator, Civil Service (Medical): No    Lack of Transportation (Non-Medical): No  Physical Activity: Insufficiently Active (11/28/2023)   Exercise Vital Sign    Days of Exercise per Week: 3 days    Minutes of Exercise per Session: 30 min  Stress: No Stress Concern Present (11/28/2023)   [employer] of Occupational Health - Occupational Stress Questionnaire    Feeling of Stress: Not at all  Social Connections: Moderately Integrated (11/28/2023)   Social Connection and Isolation Panel    Frequency of Communication with Friends and Family: More than three times a week    Frequency of Social Gatherings with Friends and Family: More than three times a week    Attends Religious Services: 1 to 4 times per year    Active Member of [employer] or Organizations: No    Attends Banker Meetings: Never  Marital Status: Married  Catering manager Violence: Not At Risk (11/28/2023)   Humiliation, Afraid, Rape, and Kick questionnaire    Fear of Current or Ex-Partner: No    Emotionally Abused: No    Physically Abused: No    Sexually Abused: No    Family History  Problem Relation Age of Onset   Cancer Father    Cancer Maternal Grandmother     Past Surgical History:  Procedure Laterality Date   BREAST ENHANCEMENT SURGERY     CESAREAN SECTION N/A 06/09/2016   Procedure: CESAREAN SECTION;  Surgeon: Ozell LITTIE Cowman, MD;  Location: [facility] SU[facility];  Service: Obstetrics;  Laterality: N/A;    LAPAROSCOPIC GASTRIC SLEEVE RESECTION      ROS: Review of Systems Negative except as stated above  PHYSICAL EXAM: BP (!) 131/90   Pulse 76   Temp 98.7 F (37.1 C) (Oral)   Resp 16   Ht 5' 7 (1.702 m)   Wt 183 lb 3.2 oz (83.1 kg)   LMP 11/20/2023 (Approximate)   SpO2 98%   BMI 28.69 kg/m   Physical Exam HENT:     Head: Normocephalic and atraumatic.     Nose: Nose normal.     Mouth/Throat:     Mouth: Mucous membranes are moist.     Pharynx: Oropharynx is clear.  Eyes:     Extraocular Movements: Extraocular movements intact.     Conjunctiva/sclera: Conjunctivae normal.     Pupils: Pupils are equal, round, and reactive to light.  Cardiovascular:     Rate and Rhythm: Normal rate and regular rhythm.     Pulses: Normal pulses.     Heart sounds: Normal heart sounds.  Pulmonary:     Effort: Pulmonary effort is normal.     Breath sounds: Normal breath sounds.  Musculoskeletal:        General: Normal range of motion.     Cervical back: Normal range of motion and neck supple.  Neurological:     General: No focal deficit present.     Mental Status: She is alert and oriented to person, place, and time.  Psychiatric:        Mood and Affect: Mood normal.        Behavior: Behavior normal.    ASSESSMENT AND PLAN: 1. Generalized anxiety disorder (Primary) - Patient denies thoughts of self-harm, suicidal ideations, homicidal ideations. - Continue Hydroxyzine  as prescribed. Counseled on medication adherence/adverse effects.  - Follow-up with primary provider in 3 months or sooner if needed.  - hydrOXYzine  (ATARAX ) 25 MG tablet; Take 1 tablet (25 mg total) by mouth every 8 (eight) hours as needed.  Dispense: 90 tablet; Refill: 2  Patient was given the opportunity to ask questions.  Patient verbalized understanding of the plan and was able to repeat key elements of the plan. Patient was given clear instructions to go to Emergency Department or return to medical center if symptoms  don't improve, worsen, or new problems develop.The patient verbalized understanding.    Requested Prescriptions   Signed Prescriptions Disp Refills   hydrOXYzine  (ATARAX ) 25 MG tablet 90 tablet 2    Sig: Take 1 tablet (25 mg total) by mouth every 8 (eight) hours as needed.    Return in about 3 months (around 02/28/2024) for Follow-Up or next available chronic conditions.  Greig JINNY Drones, NP

## 2023-12-26 ENCOUNTER — Ambulatory Visit: Admitting: Family

## 2024-04-11 ENCOUNTER — Other Ambulatory Visit: Payer: Self-pay | Admitting: Emergency Medicine

## 2024-04-11 DIAGNOSIS — F411 Generalized anxiety disorder: Secondary | ICD-10-CM

## 2024-04-11 NOTE — Telephone Encounter (Signed)
 Refill request

## 2024-04-12 MED ORDER — HYDROXYZINE HCL 25 MG PO TABS: 25.0000 mg | ORAL_TABLET | Freq: Three times a day (TID) | ORAL | 2 refills | Status: AC | PRN

## 2024-04-12 NOTE — Telephone Encounter (Signed)
 Complete
